# Patient Record
Sex: Male | Born: 1935 | Race: White | Hispanic: No | Smoking: Former smoker
Health system: Southern US, Community
[De-identification: ages and names within clinical notes are randomized; demographics above are authoritative.]

## PROBLEM LIST (undated history)

## (undated) DIAGNOSIS — K5900 Constipation, unspecified: Secondary | ICD-10-CM

## (undated) DIAGNOSIS — E7800 Pure hypercholesterolemia, unspecified: Secondary | ICD-10-CM

## (undated) DIAGNOSIS — E079 Disorder of thyroid, unspecified: Secondary | ICD-10-CM

## (undated) DIAGNOSIS — N400 Enlarged prostate without lower urinary tract symptoms: Secondary | ICD-10-CM

## (undated) DIAGNOSIS — J90 Pleural effusion, not elsewhere classified: Secondary | ICD-10-CM

## (undated) DIAGNOSIS — Z952 Presence of prosthetic heart valve: Secondary | ICD-10-CM

## (undated) DIAGNOSIS — I495 Sick sinus syndrome: Secondary | ICD-10-CM

## (undated) DIAGNOSIS — R972 Elevated prostate specific antigen [PSA]: Secondary | ICD-10-CM

## (undated) DIAGNOSIS — I10 Essential (primary) hypertension: Secondary | ICD-10-CM

## (undated) DIAGNOSIS — I2510 Atherosclerotic heart disease of native coronary artery without angina pectoris: Secondary | ICD-10-CM

## (undated) HISTORY — PX: OTHER SURGICAL HISTORY: SHX169

## (undated) HISTORY — PX: CORONARY ARTERY BYPASS GRAFT: SHX141

## (undated) HISTORY — DX: Elevated prostate specific antigen (PSA): R97.20

## (undated) HISTORY — DX: Constipation, unspecified: K59.00

---

## 2005-10-18 HISTORY — PX: INTRAOCULAR LENS INSERTION: SHX110

## 2006-10-18 HISTORY — PX: PACEMAKER INSERTION: SHX728

## 2006-12-30 ENCOUNTER — Ambulatory Visit: Payer: Self-pay | Admitting: Cardiology

## 2006-12-30 ENCOUNTER — Ambulatory Visit: Payer: Self-pay | Admitting: Thoracic Surgery (Cardiothoracic Vascular Surgery)

## 2007-01-04 ENCOUNTER — Ambulatory Visit: Payer: Self-pay | Admitting: Thoracic Surgery (Cardiothoracic Vascular Surgery)

## 2007-01-04 ENCOUNTER — Encounter (INDEPENDENT_AMBULATORY_CARE_PROVIDER_SITE_OTHER): Payer: Self-pay | Admitting: *Deleted

## 2007-01-04 ENCOUNTER — Inpatient Hospital Stay (HOSPITAL_COMMUNITY)
Admission: RE | Admit: 2007-01-04 | Discharge: 2007-01-18 | Payer: Self-pay | Admitting: Thoracic Surgery (Cardiothoracic Vascular Surgery)

## 2007-01-04 ENCOUNTER — Ambulatory Visit: Payer: Self-pay | Admitting: Cardiology

## 2007-01-30 ENCOUNTER — Encounter
Admission: RE | Admit: 2007-01-30 | Discharge: 2007-01-30 | Payer: Self-pay | Admitting: Thoracic Surgery (Cardiothoracic Vascular Surgery)

## 2007-01-30 ENCOUNTER — Ambulatory Visit: Payer: Self-pay

## 2007-01-30 ENCOUNTER — Ambulatory Visit: Payer: Self-pay | Admitting: Thoracic Surgery (Cardiothoracic Vascular Surgery)

## 2007-02-08 ENCOUNTER — Encounter: Payer: Self-pay | Admitting: Cardiology

## 2007-02-16 ENCOUNTER — Encounter: Payer: Self-pay | Admitting: Cardiology

## 2007-02-20 ENCOUNTER — Ambulatory Visit: Payer: Self-pay | Admitting: Thoracic Surgery (Cardiothoracic Vascular Surgery)

## 2007-02-20 ENCOUNTER — Encounter
Admission: RE | Admit: 2007-02-20 | Discharge: 2007-02-20 | Payer: Self-pay | Admitting: Thoracic Surgery (Cardiothoracic Vascular Surgery)

## 2007-03-19 ENCOUNTER — Encounter: Payer: Self-pay | Admitting: Cardiology

## 2007-04-18 ENCOUNTER — Encounter: Payer: Self-pay | Admitting: Cardiology

## 2007-05-02 ENCOUNTER — Ambulatory Visit: Payer: Self-pay | Admitting: Internal Medicine

## 2007-10-19 DIAGNOSIS — J90 Pleural effusion, not elsewhere classified: Secondary | ICD-10-CM | POA: Diagnosis present

## 2007-10-19 HISTORY — DX: Pleural effusion, not elsewhere classified: J90

## 2008-03-04 IMAGING — CR DG CHEST 2V
2 series · 2 of 2 positions shown · non-contrast
Comparison: 01/07/07.

CLINICAL DATA: Post CABG, shortness of breath and wheezing.
 CHEST - 2 VIEW:

[w chest pa]
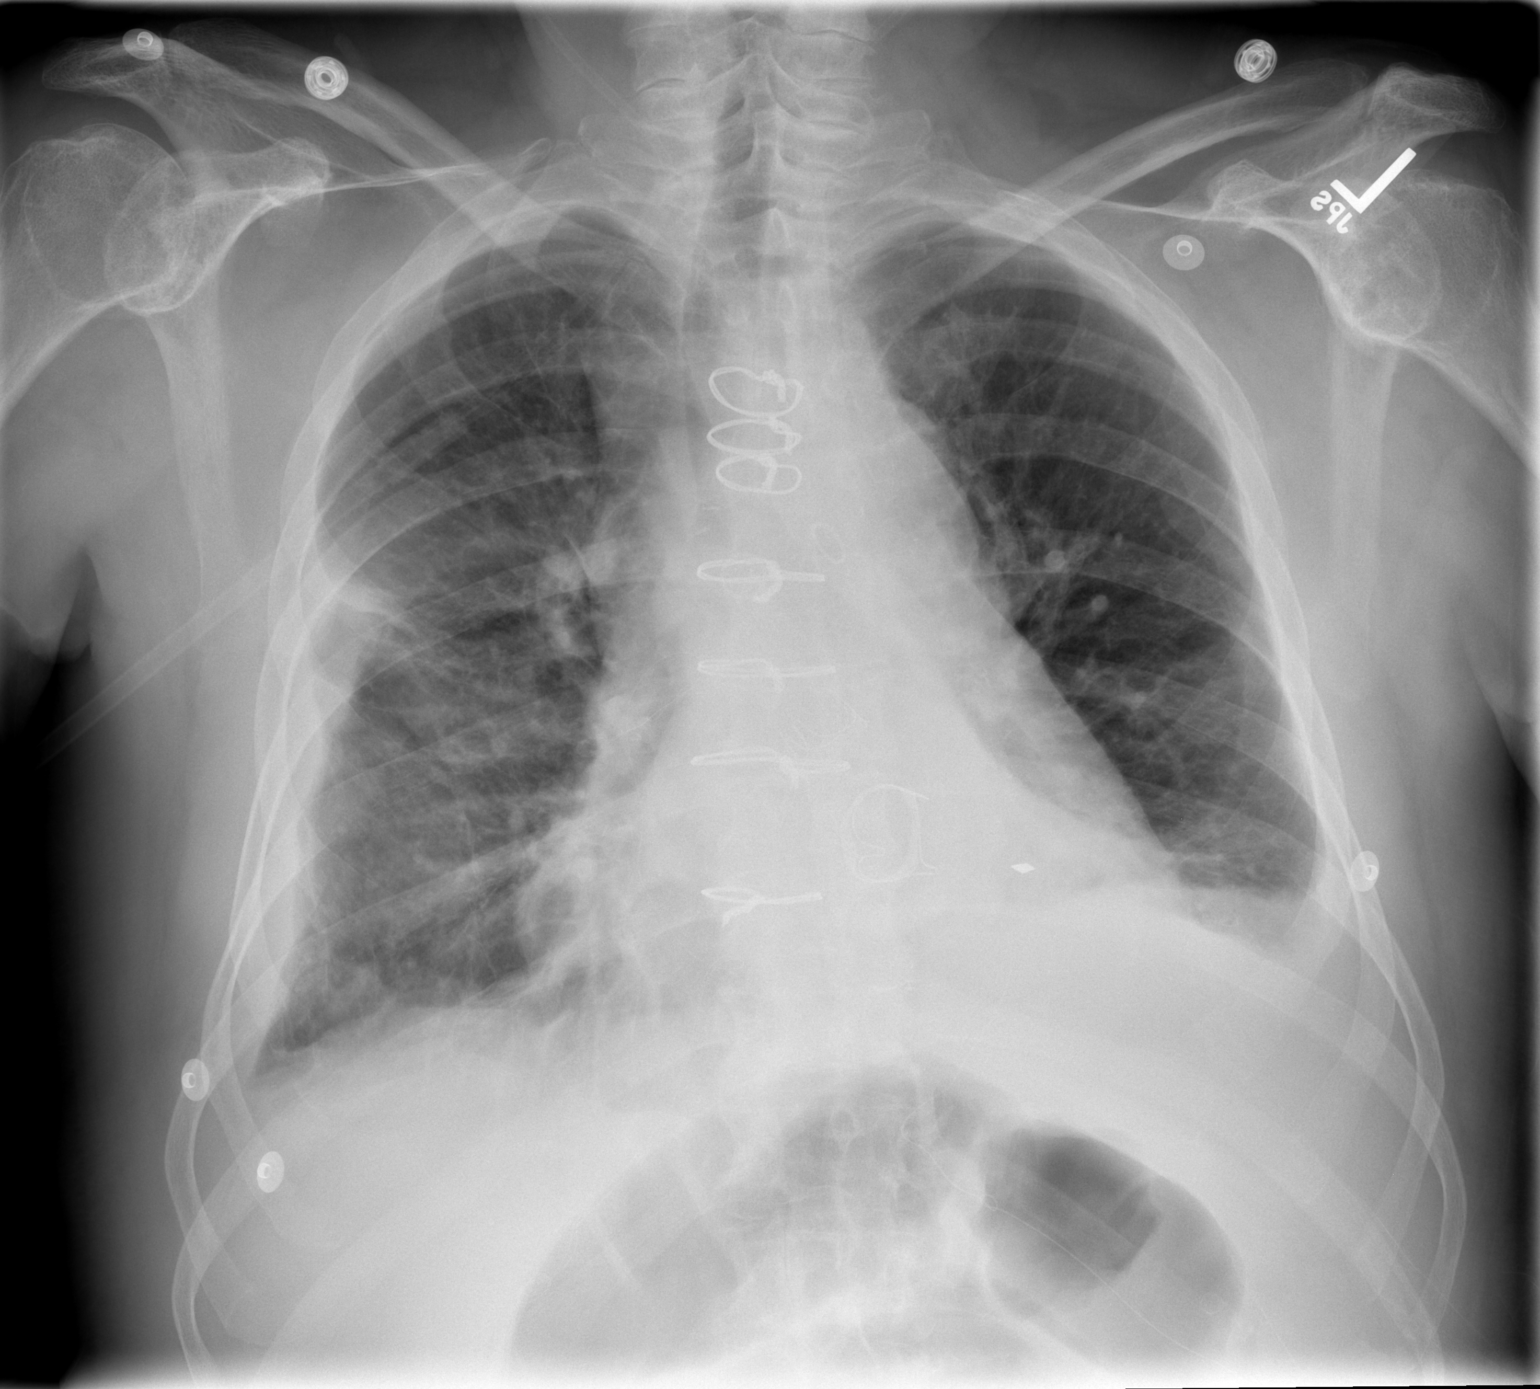

[w chest lat]
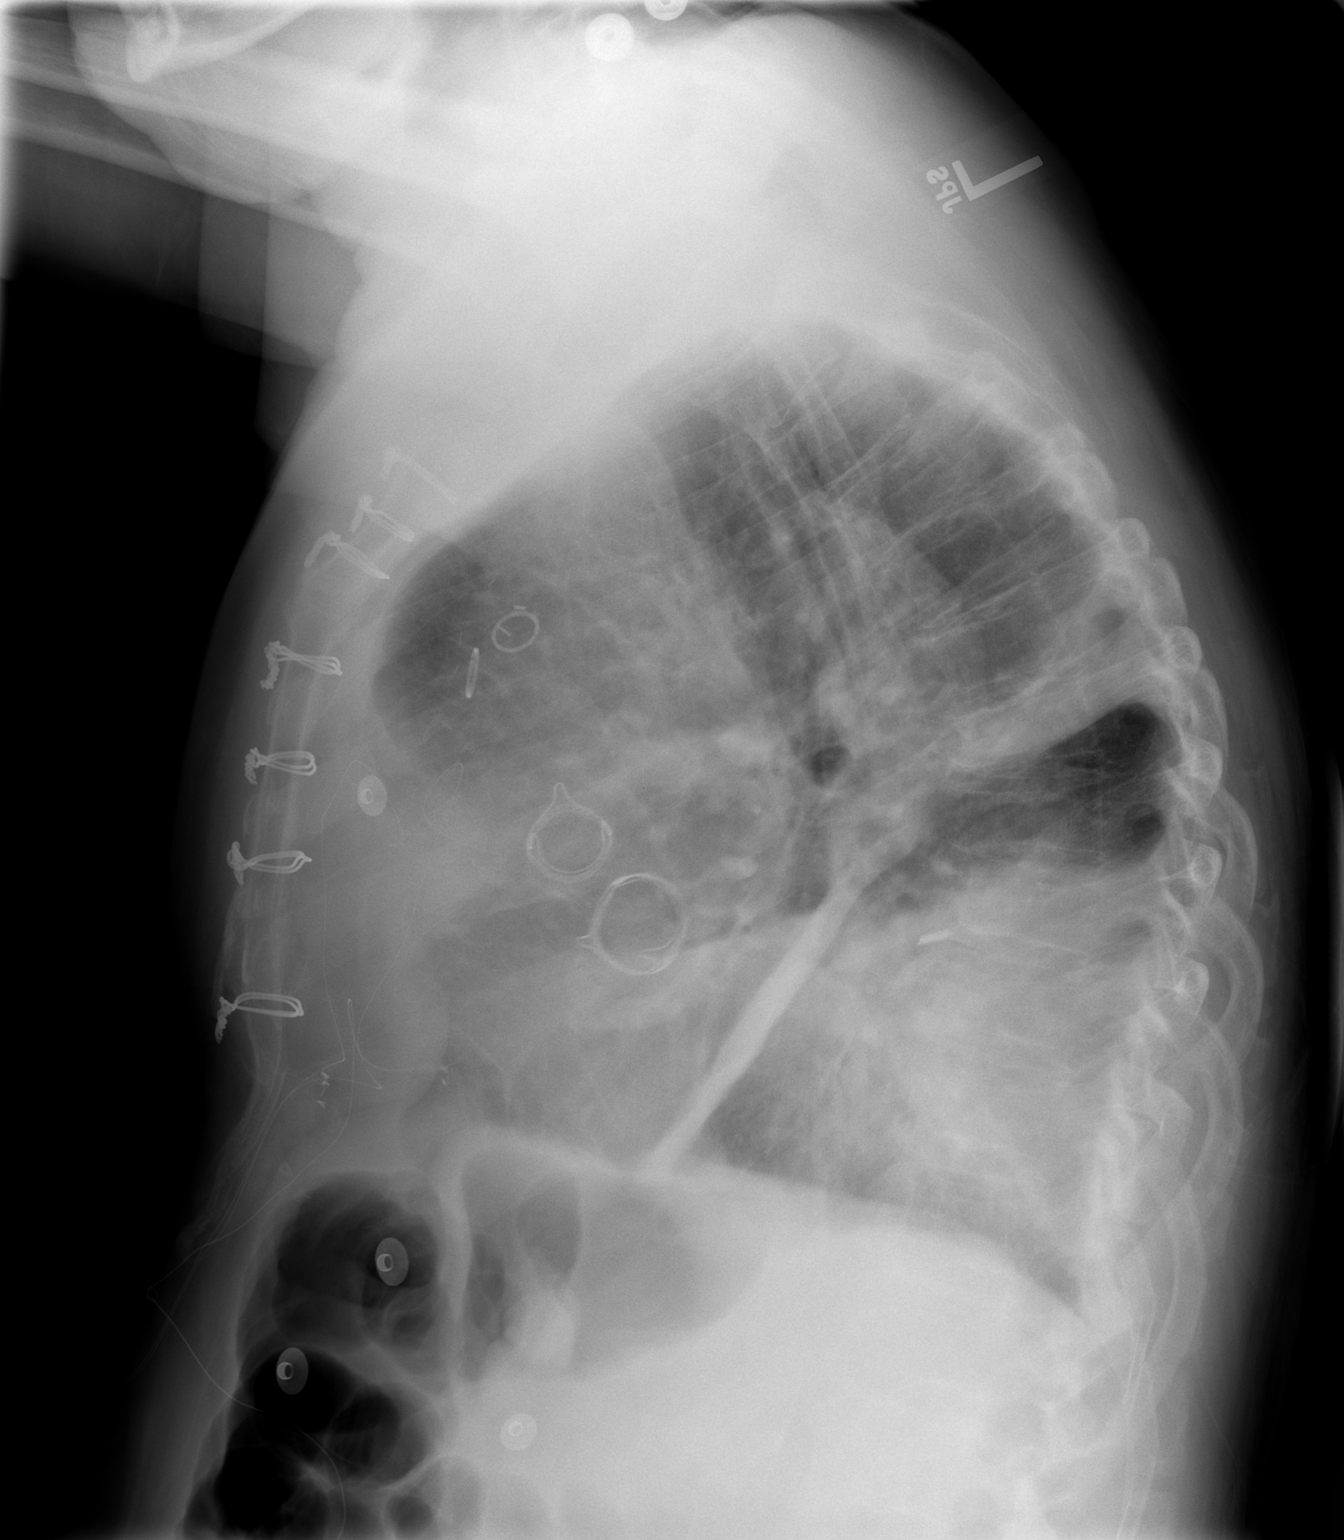

[2 of 2 positions shown; findings below may reference images not displayed]

FINDINGS: Trachea is midline. Heart size stable.  Left lower lobe atelectasis has increased in the interval. There are bilateral pleural effusions with slight increase on the right.  Laterally loculated fluid is also seen on the right.  There is interstitial prominence bilaterally, increased from the prior. Mild anterior wedging of the mid thoracic vertebral body is seen.
IMPRESSION: Pulmonary edema, bilateral pleural effusions, and worsening left lower lobe atelectasis. Please see discussion above.

## 2008-05-24 ENCOUNTER — Ambulatory Visit: Payer: Self-pay

## 2008-08-13 ENCOUNTER — Ambulatory Visit: Payer: Self-pay | Admitting: Specialist

## 2008-10-18 HISTORY — PX: TURP VAPORIZATION: SUR1397

## 2009-02-24 ENCOUNTER — Ambulatory Visit: Payer: Self-pay | Admitting: Gastroenterology

## 2009-08-11 ENCOUNTER — Ambulatory Visit: Payer: Self-pay | Admitting: General Practice

## 2009-08-27 ENCOUNTER — Inpatient Hospital Stay: Payer: Self-pay | Admitting: General Practice

## 2009-10-18 HISTORY — PX: MEDIAL PARTIAL KNEE REPLACEMENT: SHX5965

## 2009-11-11 ENCOUNTER — Ambulatory Visit: Payer: Self-pay | Admitting: General Practice

## 2009-11-26 ENCOUNTER — Inpatient Hospital Stay: Payer: Self-pay | Admitting: General Practice

## 2009-12-02 ENCOUNTER — Inpatient Hospital Stay: Payer: Self-pay | Admitting: Internal Medicine

## 2011-03-02 NOTE — Assessment & Plan Note (Signed)
[facility]                         ELECTROPHYSIOLOGY OFFICE NOTE   SA[place]CLAIR, SCHROADER                    MRN:          [id number]  DATE:05/02/2007                            DOB:          September 07, 1934    SUBJECTIVE:  Glenn [name] returns today for followup.  He is a very  pleasant elderly male, with a history of symptomatic bradycardia  following aortic valve replacement back in March.  He underwent a  permanent pacemaker insertion at that time and returns today for  followup.  Overall he is doing very well.  He has very minimal dyspnea  with exertion.   MEDICATIONS:  1. Potassium.  2. Furosemide 40 q.d.  3. Mucinex.  4. Amiodarone 200 mg b.i.d.  5. Metoprolol 50 mg q.d.  6. Aspirin 81 mg q.d.   PHYSICAL EXAMINATION:  GENERAL:  He is a pleasant elderly-appearing man,  in no acute distress.  VITAL SIGNS:  Blood pressure today 124/72, pulse 60 and regular,  respirations 18, weight 168 pounds.  NECK:  No jugular venous distention.  LUNGS:  Clear bilaterally to auscultation.  No wheezes, rales or rhonchi  are present.  CARDIOVASCULAR:  A regular rate and rhythm with normal S1 and S2.  EXTREMITIES:  Demonstrated no clubbing, cyanosis or edema.  Pulses 2+  and symmetric.   Interrogation of his pacemaker demonstrates a Medtronic [place] with P-  waves and R-waves of 2 and 16 respectively.  The impedance is 549 in the  atrium, 522 in the ventricle.  Threshold is 0.7 and 0.4 in the atrium  and 0.5 at 0.4 in the ventricle.  He was 99% A-paced.  There were no  mode switches.   IMPRESSION:  1. Symptomatic bradycardia.  2. Paroxysmal atrial fibrillation following aortic valve replacement.  3. Status post aortic valve replacement.   DISCUSSION:  Overall Glenn [name] is stable.  I have asked that he  follow up with me on a p.r.n. basis, as he will follow up with Dr. Jamse Mead in [place], who is his primary cardiologist.  We have also  asked that he discontinue his amiodarone.     Doylene Canning. Ladona Ridgel, MD  Electronically Signed    GWT/MedQ  DD: 05/02/2007  DT: 05/02/2007  Job #: [id number]   cc:   Jamse Mead, MD

## 2011-03-05 NOTE — Op Note (Signed)
NAMEMARTAVIOUS, HARTEL NO.:  [id number]   MEDICAL RECORD NO.:  [id number]          PATIENT TYPE:  INP   LOCATION:  2308                         FACILITY:  [facility]   PHYSICIAN:  [place] Forts, M.D.DATE OF BIRTH:  [birth year]   DATE OF PROCEDURE:  DATE OF DISCHARGE:                               OPERATIVE REPORT   TRANSESOPHAGEAL ECHOCARDIOGRAPHIC REPORT   INDICATIONS FOR PROCEDURE:  Glenn [name] is a 77 year old gentleman who  presents today for aortic valve replacement, coronary artery bypass  grafting to be performed by Dr. Tressie Stalker.  He is brought to the  holding area where under local anesthesia and sedation, pulmonary  catheter and radial arterial lines are inserted.  He is then taken to  the OR for routine induction of general anesthesia.  The TE probe is  then protected, lubricated and passed oropharyngeally into the stoma and  then slightly withdrawn per imaging of the cardiac structures.   PRECARDIOPULMONARY BYPASS EXAMINATION:  Left ventricle:  There is good  left ventricular contractility seen in a short axis view.  Overall there  is concentric left ventricular hypertrophy noted in all chamber walls  and segments.  Papillary muscles are well outlined.  Long axis view of  the left ventricular chamber again reveals excellent left ventricular  function.  Mitral valve:  There is heavy calcium noted in the annular area of the  mitral valve.  There is prolapse seen in a portion of the posterior  leaflet.  This is associated with on color Doppler examination 2 to 3+  mitral regurgitant flow that would be considered at least moderate in  its characteristic.  The depth of the regurgitant jet is well into the  left atrium approximately 3/4 of the extent of the left atrium.  Pulse  wave Doppler in the pulmonary valve reveals that the ST waves are  essentially normal which would indicate it is not severe, however, it is  assessed as potentially moderate MR  in this patient.  Aortic valve:  The aortic valve is heavily calcified.  There are 3 cusps  seen.  The left coronary cusp is essentially slightly mobile, but the  rest of the 2 coronary cusps non- and right coronary cusp were  essentially fused without much if any movement at all.   Right ventricle, tricuspid valve, right atrium are simply normal  chambers and valves noted.   The patient is placed on cardiopulmonary bypass.  Hypothermia is begun.   PROCEDURE:  Mitral valve replacement, aortic valve replacement and  coronary artery bypass grafting.   Deairing maneuvers were carried out and the patient was able to be  separated ultimately from cardiopulmonary bypass.   Postcardiopulmonary bypass examination, the sonographer was not  available for that so this part of the dictation will be admitted,  however, the patient was subsequently able to be returned to the cardiac  intensive care unit in stable condition.           ______________________________  [place] Forts, M.D.     JTM/MEDQ  D:  01/05/2007  T:  01/05/2007  Job:  [id number]

## 2011-03-05 NOTE — Assessment & Plan Note (Signed)
[facility]                         ELECTROPHYSIOLOGY OFFICE NOTE   COOLIDGE, GOSSARD                    MRN:          [id number]  DATE:01/30/2007                            DOB:          1934-08-26    [name] was seen today in the clinic on January 30, 2007 for a wound  check of his newly implanted Medtronic, model #SCDR01, [place].  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   On interrogation of his device today, his battery voltage is 2.78.  T  waves measured 2 to 2.8 mV with an atrial capture threshold of 0.75  volts at 0.4 ms and an atrial lead impedence of 578 ohm.  R waves  measured 16 to 22.4 mV with a ventricular capture threshold of 0.75  volts at 0.4 ms and a ventricular lead impedence of 605 ohm.  He is V-  paced about 0.3% of the time.  No changes were made in his parameters.  His Steri-Strips had already been removed.  His wound is without redness  or edema.  He will follow up with Dr. [name] in July.      Glenn Harm, LPN  Electronically Signed      Glenn [name]. Glenn Ridgel, MD  Electronically Signed   PO/MedQ  DD: 01/30/2007  DT: 01/30/2007  Job #: [id number]

## 2011-03-05 NOTE — Discharge Summary (Signed)
Glenn [name], Glenn [name] NO.:  [id number]   MEDICAL RECORD NO.:  [id number]          PATIENT TYPE:  INP   LOCATION:  2035                         FACILITY:  [facility]   PHYSICIAN:  Salvatore Decent. Cornelius Moras, M.D. DATE OF BIRTH:  1934-04-28   DATE OF ADMISSION:  01/04/2007  DATE OF DISCHARGE:                               DISCHARGE SUMMARY   PRIMARY DIAGNOSES:  1. Severe aortic stenosis.  2. Left main disease.  3. Moderate 3+ mitral regurgitation.   IN HOSPITAL DIAGNOSES:  1. Postoperative acute renal insufficiency.  2. Postoperative acute blood loss anemia.  3. Postoperative ileus.  4. Postoperative volume overload.   SECONDARY DIAGNOSES:  1. Hypertension.  2. Hyperlipidemia.  3. Status post cataract extraction.   IN HOSPITAL OPERATIONS AND PROCEDURES:  1. Aortic valve replacement using a 21 mm pericardial tissue valve.  2. Mitral valve replacement using a 25 mm pericardial tissue valve.  3. Coronary artery bypass grafting x3 using left internal mammary      artery to distal left anterior descending coronary artery,      saphenous vein graft to circumflex marginal branch, saphenous vein      graft to posterior descending coronary artery. Endoscopic saphenous      vein harvesting from right side.  4. Intraoperative transesophageal echocardiogram.   HOSPITAL COURSE:  The patient is a 77 year old gentleman with known  history of moderate aortic stenosis, hypertension, hyperlipidemia. He  now presents with progressive symptoms of exertional angina.  Echocardiogram confirmed presence of moderate to severe aortic stenosis.  Left and right heart catheterization demonstrates left main coronary  artery disease. Left ventricular function remains preserved. The patient  was seen and evaluated by Dr. Cornelius Moras. Dr. Cornelius Moras discussed with the patient  undergoing coronary artery bypass grafting as well as aortic valve  repair with intraoperative transesophageal echocardiogram. He  discussed  risks and benefits with the patient. The patient acknowledges  understanding and agrees to proceed. Surgery was scheduled for  March19,2008. For details of the patient's past medical History  and Physical exam, please see dictated History and Physical.   The patient was taken to the operating room [id number] where he  initially underwent intraoperative transesophageal echocardiogram. This  demonstrated severe aortic stenosis as by 2-D echocardiogram as well as  3+ mitral regurgitation. Dr. Cornelius Moras then proceeded with aortic valve  replacement using a 21 mm pericardial tissue valve, mitral valve  replacement using 25 mm pericardial tissue valve, and coronary artery  bypass grafting x3 using a left internal mammary artery to distal left  anterior descending coronary artery, saphenous vein graft to circumflex  marginal branch, saphenous vein graft to posterior descending coronary  artery. Endoscopic saphenous vein harvest was done from right side. The  patient tolerated this procedure. Was transferred to the intensive care  unit in stable condition. Postoperatively the patient was noted to be  hemodynamically stable. He was extubated the evening of surgery.  Following extubation the patient noted to be alert and oriented x4.  Postop day #1 the patient remained hemodynamically stable with  hemoglobin and hematocrit 10.3 and 29.7. Systolic blood pressure was  stable. The patient was able to be weaned from all drips. The patient's  postoperative chest x-ray showed no significant pleural effusion or  pneumothorax. Chest tubes were DC'd. During the patient's postoperative  course his hemoglobin and hematocrit dropped to 7.9 and 22.4. At that  time 1 unit of packed red blood cells was transfused. The following day  hemoglobin/hematocrit increased appropriately to 9.5 and 26.0. Has  remained stable during the remainder of his postoperative course. The  patient also developed acute renal  insufficiency with creatinine  increased to 2.07 postop day #3. He was back near his baseline weight at  that time, and Levophed was DC'd. The following day the patient's  creatinine started trending down and by postop day #7 creatinine was  1.04. The patient's weight was stable and back near  baseline. Postop  day #3 the patient noted to have diarrhea. Postop laxatives were held at  that time. They continued, and stool was sent for C. difficile. This  came back negative. It was felt the patient was recovering from a  resolving ileus, very improved. By postop day #5 the patient was  tolerating regular diet well. No nausea, vomiting. During the patient's  postoperative course he did have several runs of ventricular  tachycardia. Beta blocker was increased appropriately. The patient's  heart rate stabilized, and it was felt blood pressure was stable. The  patient remained afebrile postoperatively. He was able to be weaned off  oxygen satting greater than 90% on room air. The patient was noted to be  in normal sinus rhythm prior to discharge home. Pulmonary status was  stable. Incisions were clean, dry, and intact and healing well. The  patient was out of bed and ambulating well. He was tolerating diet well.  Last labs noted postop day #6 with white count 5.8, hemoglobin 9.5,  hematocrit 26.8, platelet count 173. BNP on postop day #7 showed sodium  133, potassium 3.8, chloride 99, bicarb 27, BUN 16, creatinine 1.04,  glucose 98.   The patient is tentatively ready for discharge home in the a.m. postop  day #8 March27,2008.   FOLLOW-UP APPOINTMENTS:  A follow-up appointment has been arranged with  Dr. Cornelius Moras for Monday, April14,2008, at 11:16 a.m.. The patient will  need to obtain a chest x-ray one hour prior to his appointment at  [facility]. The patient will also need to follow up with  Dr. Darrold Junker in two weeks. He will need to call to schedule this appointment.   ACTIVITY:   Patient instructed no driving until released to do so, no  heavy lifting over 10 pounds. He is told to ambulate 3-4 times per day,  progress as tolerated, and continue his breathing exercises.   INCISIONAL CARE:  The patient told to shower washing his incisions using  soap and water. He is to contact the office if he develops any drainage  or opening from any of his incision sites.   DIET:  The patient is educated on diet to be low-fat, low-salt.   DISCHARGE MEDICATIONS:  1. Aspirin 325 mg daily.  2. Lopressor 75 mg b.i.d.  3. Advicor 500/20 daily.  4. Ultram 50 mg 1-2 tabs q.4-6 h. p.r.n. pain.      Theda Belfast, PA      Salvatore Decent. Cornelius Moras, M.D.  Electronically Signed    KMD/MEDQ  D:  01/11/2007  T:  01/11/2007  Job:  [id number]

## 2011-03-05 NOTE — Consult Note (Signed)
NAMEKYLO, GAVIN NO.:  [id number]   MEDICAL RECORD NO.:  [id number]          PATIENT TYPE:  INP   LOCATION:  2035                         FACILITY:  [facility]   PHYSICIAN:  Salvadore Farber, MD  DATE OF BIRTH:  [birth year]   DATE OF CONSULTATION:  01/13/2007  DATE OF DISCHARGE:                                 CONSULTATION   PRIMARY CARDIOLOGIST:  Dr. Jamse Mead in [place].   REASON FOR CONSULTATION:  Postop atrial fibrillation.   HISTORY OF PRESENT ILLNESS:  Mr. Harnois is a 77 year old gentleman now  postop day #9 after coronary artery bypass grafting, aortic valve  replacement, and mitral valve replacement. Pericardial tissue valves  were used in both positions. His preop ejection fraction was 60%. His  postoperative course has been slowed with some diarrhea which is now  resolved. He does remain dyspneic with minimal exertion.   This morning he developed new onset atrial fibrillation with rapid  ventricular response, ventricular rate approximately 130 beats per  minute. He is not having any chest discomfort or increase in his  baseline dyspnea. He is not having any lightheadedness.   PAST MEDICAL HISTORY:  1. Coronary artery disease status post CABG.  2. Status post aortic valve replacement, mitral valve replacement (21      mm pericardial tissue valve at the aortic position and 25 mm      pericardial tissue valve in the mitral position).  3. Postoperative renal insufficiency now resolved.  4. Postoperative ileus now resolved.  5. Hypertension.  6. Hypercholesterolemia.  7. Status post cataract extraction.   ALLERGIES:  None known.   CURRENT MEDICATIONS:  1. Aspirin 325 mg daily.  2. Lovastatin 20 mg daily.  3. Metoprolol 25 mg twice daily.  4. Ultram p.r.n..  5. Lovenox 40 mg daily started today.   SOCIAL HISTORY:  The patient is widowed. Lives with his daughter in  [place]. He retired from working in Designer, fashion/clothing. He has a remote  tobacco history, but quit more than 30 years ago. Denies alcohol use.   FAMILY HISTORY:  Noncontributory.   REVIEW OF SYSTEMS:  Negative in detail except as above.   PHYSICAL EXAMINATION:  GENERAL:  He is fairly comfortable-appearing in  no distress.  VITAL SIGNS:  Heart rate 140, blood pressure 138/80, respiratory rate  22, oxygen saturation 99% on room air, afebrile.  HEENT:  Normal.  MUSCULOSKELETAL:  Normal.  SKIN:  Notable for ecchymosis along the inner aspect of his right thigh  at his saphenous vein harvest site. There is no hematoma.  NECK:  He has no thyromegaly. There is jugular venous distension to the  angle of the jaw at 45 degrees. He has no cervical or supraclavicular  lymphadenopathy.  LUNGS:  Respiratory effort is normal. Clear to auscultation though  markedly decreased breath sounds in the lower portion of both lung  fields.  HEART:  He has a nondisplaced point of maximal cardiac impulse. He is  irregularly irregular rhythm and is tachycardic. Normal S1, S2. No  murmur or rub.  ABDOMEN:  Soft, nondistended, nontender. There is no hepatosplenomegaly.  Bowel  sounds are normal.  EXTREMITIES:  Warm without clubbing, cyanosis, edema or ulceration.  Carotid pulses 2+ bilaterally without bruit.  NEUROLOGIC:  He is alert and oriented x3 with normal affect and normal  neurologic exam.   IMPRESSION/RECOMMENDATIONS:  1. Atrial fibrillation with rapid ventricular response:  Discussed      with Dr. Donata Clay. He has only been on diltiazem for 15 minutes      and did not receive a bolus. Will bolus him with diltiazem. Will      increase his metoprolol to 50 mg three times daily. What had      previously been felt was bradycardia actually appears to be blocked      PACs. I thus think he will likely tolerate metoprolol at the prior      dose. We will also initiate amiodarone. It is my hope that will be      able to discontinue the diltiazem once the higher dose of       metoprolol was added. I will also add Coumadin and increase his      Lovenox to treatment dose.  2. Congestive heart failure:  Ejection fraction was preserved      preoperatively. Will check echocardiogram on Monday. He remains      quite volume overloaded. We will add Lasix.      Salvadore Farber, MD  Electronically Signed     WED/MEDQ  D:  01/13/2007  T:  01/13/2007  Job:  [id number]   cc:   Kerin Perna, M.D.

## 2011-03-05 NOTE — Op Note (Signed)
NAMEMARGUERITE, [name] NO.:  [id number]   MEDICAL RECORD NO.:  [id number]          PATIENT TYPE:  INP   LOCATION:  2035                         FACILITY:  [facility]   PHYSICIAN:  Doylene Canning. Ladona Ridgel, MD    DATE OF BIRTH:  [birth year]   DATE OF PROCEDURE:  01/16/2007  DATE OF DISCHARGE:                               OPERATIVE REPORT   PROCEDURE PERFORMED:  Implantation of a dual-chamber pacemaker.   INDICATIONS:  Symptomatic tachy-brady syndrome following aortic valve  replacement, mitral valve replacement, and coronary artery bypass  surgery in a patient with underlying left bundle branch block.   PROCEDURE:  After informed consent was obtained, the patient is taken  diagnostic EP lab in the fasting state.  After the usual preparation and  draping, 30 mL of lidocaine was infiltrated in the left infraclavicular  region.  A 5 cm incision was carried out over this region.  Electrocautery was utilized to dissect down to the fascial plane.  The  left subclavian vein was punctured times two and the Medtronic model  5076 58 cm active fixation pacing lead, serial number [id number] was  advanced into the right ventricle and the Medtronic model 5076 52 cm  active fixation pacing lead serial number [id number] was advanced to the  right atrium.  Mapping was subsequently carried out in the right  ventricle, at the final site the R-waves were 11 mV, the pacing  impedance was 742 ohms with the lead actively fixed and the pacing  threshold 0.7 volts and 0.4 milliseconds.  The 10 volt pacing did not  stimulate the diaphragm and with lead actively fixed there was a large  injury current.  With all of the above, attention was then turned to  placement atrial lead which was placed in the anterolateral portion of  the right atrium where P-waves measured 3 mV and the pacing impedance  606 ohms with the lead actively fixed, the threshold 0.5 volts and 0.4  milliseconds.  The 10 volts pacing  again did not stimulate the diaphragm  and with the lead actively fixed there was a nice injury current  demonstrated.  With all the above, the leads were secured to  subpectoralis fascia with a silk suture.  The sewing sleeve was secured  with silk suture.  Electrocautery was utilized to make a subcutaneous  pocket.  Kanamycin irrigation was utilized to irrigate the pocket.  The  Medtronic [place] serial number [id number] was then connected to the  atrial and ventricular pacing leads, and placed in the subcutaneous  pocket.  The generator was secured with silk suture.  Additional  kanamycin was utilized to irrigate the pocket and the incision then  closed with a layer of 2-0 Vicryl, followed by layer of 3-0 Vicryl,  followed by layer of 4-0 Vicryl.  Benzoin was painted on skin, Steri-  Strips were applied, and pressure dressing was placed.  The patient was  returned to his room in satisfactory condition.   COMPLICATIONS:  There were no immediate procedure complications.   RESULTS:  This demonstrate successful implantation of a Medtronic dual-  chamber pacemaker  in a patient with symptomatic tachy-brady syndrome  following aortic and mitral valve replacement and bypass surgery.      Doylene Canning. Ladona Ridgel, MD  Electronically Signed     GWT/MEDQ  D:  01/16/2007  T:  01/16/2007  Job:  [id number]   cc:   Horald Chestnut, M.D.  Bruce Donath, M.D.

## 2011-03-05 NOTE — Op Note (Signed)
NAMEGLENMORE, KARL             ACCOUNT NO.:  [id number]   MEDICAL RECORD NO.:  [id number]          PATIENT TYPE:  INP   LOCATION:  2308                         FACILITY:  [facility]   PHYSICIAN:  Salvatore Decent. Cornelius Moras, M.D. DATE OF BIRTH:  06/06/1934   DATE OF PROCEDURE:  01/04/2007  DATE OF DISCHARGE:                               OPERATIVE REPORT   PREOPERATIVE DIAGNOSIS:  Severe aortic stenosis with left main disease.   POSTOPERATIVE DIAGNOSIS:  Severe aortic stenosis with left main disease  with moderate (3+) mitral regurgitation.   PROCEDURE:  Median sternotomy for aortic valve replacement (21 mm  pericardial tissue valve), mitral valve replacement (25 mm pericardial  tissue valve) and coronary artery bypass grafting x3 (left internal  mammary artery to distal left anterior descending coronary artery,  saphenous vein graft to circumflex marginal branch, saphenous vein graft  to posterior descending coronary artery, endoscopic saphenous vein  harvest from right thigh).   SURGEON:  Salvatore Decent. Cornelius Moras, M.D.   ASSISTANT:  Zadie Rhine, P.A.-C.   ANESTHESIA:  General.   BRIEF CLINICAL NOTE:  The patient is a 77 year old gentleman with known  history of moderate aortic stenosis, hypertension, and  hypercholesterolemia.  He now presents with progressive symptoms of  exertional angina.  Echocardiogram confirms the presence of moderate to  severe aortic stenosis.  Left and right heart catheterization  demonstrates left main coronary artery disease.  Left ventricular  function remains preserved.  A full consultation note has been dictated  previously.  The patient and his family have been counseled at length  regarding the indications, risks, and potential benefits of surgery.  Alternative treatment strategies have been discussed.  They understand  and accept all associated risks of surgery and desire to proceed as  described.   OPERATIVE FINDINGS:  1. Normal left ventricular  systolic function.  2. Severe calcific aortic stenosis.  3. Moderate (3+) mitral regurgitation with severely calcified      posterior mitral annulus.  4. Mild to moderate left ventricular hypertrophy.  5. Good quality saphenous vein conduit.  6. Good quality left internal mammary artery conduit, although      somewhat small caliber.  7. Good quality target vessels for grafting.   OPERATIVE NOTE IN DETAIL:  The patient was brought to the operating room  on the above mentioned date and central monitoring is established by the  anesthesia service under the care and direction of Dr. Sharee Holster.  Specifically, a Swan-Ganz catheter was placed through the right internal  jugular approach.  A radial arterial line was placed.  Intravenous  antibiotics are administered.  Following induction with general  endotracheal anesthesia, a Foley catheter was placed.  The patient's  chest, abdomen, both groins, and both lower extremities are prepped and  draped in a sterile manner.   Baseline transesophageal echocardiogram is performed by Dr. Jacklynn Bue.  This demonstrates mild to moderate left ventricular hypertrophy with  normal left ventricular systolic function.  There is severe calcific  aortic stenosis.  The aortic valve is tricuspid but heavily calcified.  Two of the three cusps appear to be relatively immobile.  There is also  moderate (3+) mitral regurgitation.  There is some mild prolapse of a  portion of the posterior leaflet of the mitral valve, but the main  pathological lesion appears to be severe calcification of the entire  posterior mitral annulus.  As a result, the posterior leaflet is,  overall, somewhat restricted in many areas and there is a broad central  jet of mitral regurgitation that extends virtually completely across the  left atrium.  There is no flow reversal in the pulmonary veins.  This  degree of mitral regurgitation is appreciated in the setting of complete  general  anesthesia with relatively low systemic blood pressure.  After  considerable discussion, intervention on the mitral valve is felt to be  prudent due to concerns regarding the unlikely ability of aortic valve  replacement to improve the degree of mitral regurgitation in any  significant way.  The patient's family is notified intraoperatively.   A segment of greater saphenous vein is obtained from the patient's right  thigh using endoscopic vein harvest technique through a small incision  made just above the right knee.  The saphenous vein is a good quality  conduit.  After the saphenous vein is removed, the small surgical  incision is closed in multiple layers with running absorbable suture.   A median sternotomy incision is performed and the left internal mammary  artery dissected from the chest wall and prepared for bypass grafting.  The left internal mammary artery was slightly small caliber but,  otherwise, good quality.  It has adequate flow.  The patient is  heparinized systemically and the internal mammary artery is transected  distally.   The pericardium was opened.  The ascending aorta is mildly dilated and  mildly diseased with atherosclerotic plaque.  The ascending aorta is  cannulated at a level just beyond the innominate artery.  A venous  cannula is placed directly in the superior vena cava.  A second venous  cannula is placed low in the right atrium with tip extended down the  inferior vena cava.  Cardiopulmonary bypass is begun.  A retrograde  cardioplegia catheter was placed through the right atrium into the  coronary sinus.  A temperature probe is placed in the left ventricular  septum and pericardial cardioplegia catheter is placed in the ascending  aorta.   The patient is allowed to cool passively to a minimum core temperature  of 28 degrees Centigrade.  The aortic Cross clamp was applied and cold blood cardioplegia is administered initially in an antegrade  fashion  through the aortic root.  Ice saline slush is applied for topical  hypothermia.  The initial cardioplegia arrest and myocardial cooling are  felt to be excellent.  Repeat doses of cardioplegia are administered  intermittently throughout the crossclamp portion of the operation  through the aortic root and down the subsequently placed vein grafts to  maintain left ventricular septal temperature below 15 degrees  Centigrade. Cardioplegia is also administered intermittently retrograde  through the coronary sinus catheter after aortotomy has been performed.   The following distal coronary anastomoses are performed:   1. The circumflex marginal branch is grafted with a saphenous vein      graft in an end-to-side fashion.  This vessel measures 2 mm and is      a good quality target vessel at the site of distal grafting.  2. The posterior descending coronary artery is grafted with a      saphenous vein graft in an  end-to-side fashion.  This vessel      measures 1.5 mm in diameter and is a good quality target vessel at      the site of distal grafting.  3. The distal left anterior descending coronary artery is grafted with      the left internal mammary artery in an end-to-side fashion.  This      vessel measures 2 mm in diameter and is a good quality target      vessel for grafting.   A left atriotomy incision is performed posteriorly through the intra-  atrial groove.  The mitral valve is exposed using a self-retaining  retractor.  Exposure is technically difficult due to the degree of left  ventricular hypertrophy and the degree of severe calcification in the  mitral annulus making mobility of the mitral annulus restricted.  The  mitral valve is carefully examined.  There is moderate degenerative  sclerosis involving the entire mitral valve.  There is one area of  prolapse in the P3 portion of the mitral valve, although this is a  relatively small portion of the leaflet and the  degree of prolapse is  mild.  There is severe restriction of the entire posterior leaflet due  to severe calcification involving the entire posterior annulus extending  from commissure to commissure.  There is some mild calcification in the  free margin of the anterior leaflet, as well, although the anterior  leaflet has good mobility.  Due to the degree calcification and  restriction of the posterior leaflet, valve replacement is felt to be  the most prudent course.  The anterior leaflet is excised with care to  preserve primary chordae to both anterior and posterior papillary  muscle.  These are ultimately incorporated in the valve replacement  sutures and displaced laterally.  The posterior leaflet is split in the  mid portion but left intact.  Mitral valve replacement is performed  using interrupted 2-0 Ethibond horizontal mattress pledgeted sutures  with pledgets in the supra-annular position.  Once all the sutures had been placed, the valve annulus is sized.  The valve annulus corresponds  to approximately 27 mm bioprosthetic tissue valve.  A 25 mm valve is  chosen due to the plans for concomitant aortic valve replacement and  concerns regarding potential difficulty created with over sizing the  mitral valve.  An Edwards bovine pericardial tissue valve (model #6900P,  serial number [id number]) is secured in place uneventfully.  After the  valve has been completely deployed and secured in place, the sewing ring  is tested circumferentially to look for any signs of possible  perivalvular communication and none are appreciated.  The left atriotomy  incision is closed posteriorly using a two layer closure of running 3-0  Prolene suture.   Attention is now directed to the aortic valve.  An oblique transverse  aortotomy incision is performed.  The aortic valve is exposed.  The  aortic valve is tricuspid and heavily calcified.  The aortic valve is  excised sharply.  The aortic annulus is  relatively free of significant  calcification and debridement is technically straight forward.  The  aortic root is copiously irrigated with iced saline solution.  The  aortic annulus is sized to accept a 21 mm stented bovine pericardial  tissue valve.  Aortic valve replacement is performed using interrupted 2-  0 Ethibond horizontal mattress pledgeted sutures with pledgets in the  subannular position.  An [employer] series pericardial tissue valve  (model number  3000, serial number [id number]) is secured in place  uneventfully.  Care is taken to secure the sutures underneath the left  main coronary artery first, as this portion of the annulus is also  notably restricted due to the underlying calcification around the mitral  annulus.  Once the valve is completely seated, the valve is irrigated  with saline solution.  The left main and the right coronary artery are  well away from the sewing ring.  Rewarming is begun.  The aortotomy is  closed using a two layered closure of running 4-0 Prolene suture.   Both proximal saphenous vein anastomoses are performed  directly to the  ascending aorta prior to removal of the aortic cross clamp.  The patient  is placed in Trendelenburg position.  Left ventricular septal  temperature rises appropriately with reperfusion of the left internal  mammary artery.  One final dose of warm retrograde hot shot cardioplegia  is administered.  The lungs are filled and the heart filled to evacuate  any residual air through the aortic root.  The aortic cross clamp is  removed after a total cross clamp time of 215 minutes.  The heart begins  to beat spontaneously without need for cardioversion.  All proximal and  distal coronary anastomoses are inspected for hemostasis and appropriate  graft orientation.  The aortotomy and left atriotomy incisions are  inspected for hemostasis.  Epicardial pacing wires are affixed to the  right ventricular free wall and to the right  atrial appendage.  The patient is rewarmed to 37 degrees Centigrade temperature.  Protamine  infusion is begun at 3 mcg per kg per minute.  The IVC cannula is  removed and its cannulation site oversewn with Prolene suture.  The  patient is weaned from cardiopulmonary bypass without difficulty.  The  patient's rhythm at separation from bypass is third degree AV block.  AV  sequential pacing is employed.  Normal sinus rhythm resumed  spontaneously prior to transport out of the operating room.  Total  cardiopulmonary bypass time for the operation is 251 minutes.   Follow up transesophageal echocardiogram performed by Dr. Diamantina Monks  after separation from bypass demonstrates normal left ventricular  function.  There are well seated bioprosthetic tissue valves in the  aortic and mitral position with trace residual aortic insufficiency and  mitral regurgitation as expected.  No other abnormalities are noted.  There is insignificant residual air.  The venous and arterial cannulae  are removed uneventfully.  Protamine is administered to reverse the  anticoagulation.  The mediastinum and the left chest are irrigated with  saline solution containing vancomycin.  Meticulous surgical hemostasis  is ascertained.  The mediastinum and both left and right pleural spaces  are drained with four chest tubes exited through separate stab incisions  inferiorly.  The soft tissues anterior to the aorta are reapproximated  loosely.  The sternum is closed with double strength sternal wire.  The  soft tissues anterior to the sternum are closed in multiple layers and  the skin is closed with running subcuticular skin closure.   The patient tolerated the procedure well and is transported to the  surgical intensive care unit in stable condition.  There are no  intraoperative complications.  All sponge, instrument, and needle counts  were verified as correct at the completion of the operation.  The  patient was  transfused 2 units packed red blood cells during  cardiopulmonary bypass due to anemia which was present preoperatively  and  exacerbated by surgery.      Salvatore Decent. Cornelius Moras, M.D.  Electronically Signed     CHO/MEDQ  D:  01/04/2007  T:  01/04/2007  Job:  [id number]

## 2011-03-05 NOTE — H&P (Signed)
NAMEJONNIE, TRUXILLO             ACCOUNT NO.:  [id number]   MEDICAL RECORD NO.:  [id number]           PATIENT TYPE:   LOCATION:                                 FACILITY:   PHYSICIAN:  Salvatore Decent. Cornelius Moras, M.D. DATE OF BIRTH:  [birth year]   DATE OF ADMISSION:  01/04/2007  DATE OF DISCHARGE:                              HISTORY & PHYSICAL   CHIEF COMPLAINT:  Exertional chest pain and mid scapular back pain.   HISTORY OF PRESENT ILLNESS:  Mr. Spielmann is a 77 year old widowed white  male, who currently lives in [place], with known history of moderate  aortic stenosis, hypertension, and hypercholesterolemia.  The patient  states that he was first noted to have a heart murmur by Dr. Micah Noel  approximately 3 years ago.  Follow-up echocardiograms have revealed mild  to moderate aortic stenosis.  Over the last year he has developed  progressive symptoms of exertional pain that typically begins as  discomfort in his mid scapular region and sometimes radiates to the  chest.  This pain is always brought on with physical exertion and  usually promptly relieved with rest.  The frequency and severity of  symptoms have progressed over the last several months.  Most recent  follow-up echocardiogram was performed June 2007, and by report this  revealed evidence of moderate to severe aortic stenosis with peak and  mean transvalvular gradients across the valve of 49.5 and 27.7 mmHg  respectively.  The calculated aortic valve area was 0.99 cm2.  The  patient recently returned for evaluation by Dr. Darrold Junker and was  subsequently scheduled for elective left and right heart  catheterization.  This was performed earlier [facility] at [facility], and findings were compatible with moderate to severe  aortic stenosis as well as left main disease with three-vessel coronary  artery disease.  Left ventricular systolic function remained preserved.  Mr. Wenke was referred for possible  elective aortic valve replacement  and coronary artery bypass grafting.   REVIEW OF SYSTEMS:  GENERAL:  The patient reports normal appetite.  He  has gained a few pounds over the last year, although not a significant  amount.  He currently is 5 feet 7 inches tall and weighs approximately  178 pounds.  The patient does report some mild exertional fatigue.  He  otherwise feels well.  CARDIAC:  Notable for exertional angina as  described.  The patient denies any chest pain or mid scapular pain  occurring either at rest or with sleeping at night.  He denies any  prolonged episodes of pain unrelieved by rest.  He reports mild  exertional shortness of breath which has slowly been progressive.  He  denies resting shortness of breath, PND, orthopnea, or lower extremity  edema.  He reports occasional mild dizzy spells, especially if he stands  up suddenly.  He denies any syncopal episodes.  RESPIRATORY:  Negative.  The patient denies productive cough, hemoptysis, wheezing.  GASTROINTESTINAL:  Negative.  The patient does report occasional  constipation.  He denies any difficulty swallowing.  He reports no  hematochezia, hematemesis, melena.  He  has occasional hemorrhoids with  occasional small amounts of bright red blood associated.  GENITOURINARY:  Negative.  The patient reports no difficulty voiding.  MUSCULOSKELETAL:  Notable for some chronic problems with his right knee, although this  does not seem to slow him down too much.  NEUROLOGIC:  Notable for some  chronic numbness involving the first and second toes of the right foot.  This is chronic and unchanged.  The patient otherwise denies any  transient monocular blindness or transient numbness or weakness  involving either upper or lower extremity.  PSYCHIATRIC:  Negative.  HEENT:  Negative.  The patient is edentulous with full set upper and  lower dentures.  INFECTIOUS:  Negative.   PAST MEDICAL HISTORY:  1. Aortic stenosis.  2.  Hypertension.  3. Hypercholesterolemia.   PAST SURGICAL HISTORY:  Cataract extraction.   FAMILY HISTORY:  Noncontributory.   SOCIAL HISTORY:  The patient is widowed and lives with his daughter in  [place].  He has retired most recently from the [employer].  He  still remains quite active physically.  He has a remote history of  tobacco use, although he quit smoking more than 30 years previously.  He  denies any alcohol consumption.   CURRENT MEDICATIONS:  1. Aspirin 325 mg daily.  2. Diovan 160 mg daily.  3. Hydrochlorothiazide 25 mg daily.  4. Advicor 20/500 1 tablet daily.  5. Toprol XL 50 mg daily.   DRUG ALLERGIES:  None known.   PHYSICAL EXAMINATION:  The patient is a well-appearing male who appears  his stated age in no acute distress.  Blood pressure is 120/60, pulse 74 and regular, oxygen saturation 98% on  room air.  HEENT: Exam is grossly unrevealing.  NECK:  Supple.  There is no cervical nor supraclavicular  lymphadenopathy.  There is no jugular venous distension.  No carotid  bruits are noted.  CHEST:  Auscultation of the chest is notable for clear breath sounds  which are symmetrical bilaterally.  No wheezes or rhonchi are noted.  CARDIOVASCULAR:  Exam is notable for regular rate and rhythm.  There is  a prominent grade 4/6 crescendo decrescendo harsh systolic murmur heard  all across the precordium with radiation to the neck.  No diastolic  murmurs are noted.  ABDOMEN:  Soft, nontender.  There are no palpable masses.  Bowel sounds  are present.  The liver edge is not enlarged.  EXTREMITIES:  Warm and  well-perfused.  There is no lower extremity edema.  Distal pulses are  somewhat diminished but trace palpable in the posterior tibial positions  bilaterally.  There is no sign of significant venous insufficiency.  SKIN:  Clean, dry, healthy-appearing throughout.  RECTAL AND GU EXAMS:  Both deferred.  NEUROLOGIC:  Examination is grossly  nonfocal.  DIAGNOSTIC TESTS:  Cardiac catheterization performed today by Dr.  Darrold Junker at [facility] is reviewed.  This  demonstrates left main disease with three-vessel coronary artery disease  and normal left ventricular systolic function as well as moderate to  severe aortic stenosis.  Specifically, there is 70% stenosis of the  distal left main coronary artery with 95% ostial stenosis of the left  circumflex coronary artery.  There is 50% proximal stenosis of the left  anterior descending coronary artery.  There is right-dominant coronary  circulation.  There is 50-60% stenosis of the mid right coronary artery.  Left ventricular systolic function appears preserved with ejection  fraction estimated 60%.  The resting cardiac output  by thermodilution is  4.4 liters per minute with cardiac index of 2.3.  PA pressures measured  28/13 with a pulmonary capillary wedge pressure of 10.  Peak to peak  gradient across the aortic valve measured 26 mmHg.  Calculated aortic  valve area was estimated 1.17 cm2 with a mean transvalvular gradient of  18 mmHg.   IMPRESSION:  Moderate aortic stenosis with left main disease and three-  vessel coronary artery disease, preserved left ventricular systolic  function and progressive symptoms of exertional angina.  I believe that  Mr. Geralds would best be treated with prompt elective aortic valve  replacement and coronary artery bypass grafting.   PLAN:  I have discussed at length the indications, risks, potential  benefits of surgery with Mr. Wolters and his daughter.  Alternative  treatment strategies have been discussed in detail.  They understand and  accept all associated risks of surgery including but not limited to risk  of death, stroke, myocardial infarction, congestive heart failure,  respiratory failure, pneumonia, bleeding requiring blood transfusion,  arrhythmia, heart block with bradycardia requiring permanent  pacemaker,  recurrent coronary artery disease, or late complications related to  prosthetic valve.  We have also discussed options with respect to type  of valve prosthesis to be utilized to replace the patient's native  aortic valve.  We have discussed the relative risks and benefits of use  of a mechanical prosthesis versus a bioprosthetic tissue valve.  Given  the patient's age and associated coronary artery disease, I have  recommended that a bioprosthetic tissue valve would likely be best for  Mr. Mederos, and the relative risk of late structural valve  deterioration or failure during his lifetime should be very low.  We  tentatively plan to proceed with surgery on Wednesday, March 19.  All  their questions have been addressed.      Salvatore Decent. Cornelius Moras, M.D.  Electronically Signed     CHO/MEDQ  D:  12/30/2006  T:  01/01/2007  Job:  [id number]   cc:   Estanislado Pandy, MD  Jamse Mead, MD

## 2011-03-05 NOTE — Discharge Summary (Signed)
NAMEELJAY, LAVE NO.:  [id number]   MEDICAL RECORD NO.:  [id number]          PATIENT TYPE:  INP   LOCATION:  2035                         FACILITY:  [facility]   PHYSICIAN:  Salvatore Decent. Cornelius Moras, M.D. DATE OF BIRTH:  1933-12-11   DATE OF ADMISSION:  01/04/2007  DATE OF DISCHARGE:  01/18/2007                               DISCHARGE SUMMARY   ADDENDUM:  This is an addendum to the patient's discharge summary  dictated January 11, 2007.  The patient's discharge was held due to the  patient continuing to experience SVT/atrial fibrillation with  bradycardia.  The patient was started on amiodarone and Cardizem  empirically for atrial fibrillation.  EP was consulted and evaluated  patient January 05, 2007.  At that time, the patient was continued on the  amio, Cardizem, and Coumadin was added.  He was also continued on  Lopressor.  After evaluation by EP, they felt that the patient would  benefit from a dual-chamber permanent pacemaker.  This was implanted on  January 16, 2007.  The patient tolerated this procedure well.  This was  then interrogated the following day, January 17, 2007.  It was noted the  pacemaker was functioning appropriately.  The patient was noted to have  remained in normal sinus rhythm for 24+ hours straight.  He was noted  not to be a very good Coumadin candidate and with the patient remaining  in normal sinus rhythm with current medications, Coumadin was D/C'd.  Prior to discharge home, the patient was noted to be pacing and normal  sinus rhythm.  During the remainder of his hospital stay, he did develop  a persistent cough.  He was started on guaifenesin as well as Avelox.  The patient's cough did improve prior to discharge home.  Antibiotics  will be continued for their full course.  The patient's vital signs  remained stable.  He remained oxygen saturating greater than 90%.  The  patient's pulmonary status remained stable.  His incisions remained  clean,  dry and intact.   LABORATORY DATA:  Labs on January 17, 2007 showed a white count of 7.7,  hemoglobin of 9.8, hematocrit of 27.8, platelet count 341,000.  Sodium  was 131, potassium of 4.3, chloride of 94, bicarb of 27, BUN of 14,  creatinine of 1.37, glucose of 95, INR of 1.3.   DISCHARGE INSTRUCTIONS:  Patient is visibly ready for discharge home in  the AM January 18, 2007.  Please see dictated discharge summary for  discharge instructions and follow-up appointments.  A follow-up  appointment added has been arranged with Dr. Ladona Ridgel for Tuesday, May 02, 2007 at 9:40 AM.  The patient is to present to the clinic Monday,  January 30, 2007 at three o'clock PM for pacer evaluation.  EP discussed  instructions on pacemaker care.   DISCHARGE MEDICATIONS:  1. Aspirin 325 mg daily.  2. Lopressor 50 mg b.i.d.  3. Advicor 500/20 daily.  4. Amiodarone 400 mg b.i.d. times 14 days, then 200 mg b.i.d.  5. Guaifenesin 600 mg b.i.d.  6. Avelox 400 mg daily times six days.  7. Lasix 40 mg daily times seven days.  8. Potassium chloride 20 mEq daily times seven days.  9. Ultram 50 mg one to two tabs q.4-6h. p.r.n. pain.      Theda Belfast, PA      Salvatore Decent. Cornelius Moras, M.D.  Electronically Signed    KMD/MEDQ  D:  01/17/2007  T:  01/17/2007  Job:  [id number]   cc:   Doylene Canning. Ladona Ridgel, MD

## 2013-05-04 ENCOUNTER — Ambulatory Visit: Payer: Self-pay | Admitting: Specialist

## 2014-05-03 DIAGNOSIS — N400 Enlarged prostate without lower urinary tract symptoms: Secondary | ICD-10-CM | POA: Diagnosis present

## 2014-07-05 ENCOUNTER — Observation Stay (HOSPITAL_COMMUNITY)
Admission: EM | Admit: 2014-07-05 | Discharge: 2014-07-06 | Disposition: A | Discharge status: Home / Self Care | Payer: Medicare Other | Attending: Internal Medicine | Admitting: Internal Medicine

## 2014-07-05 ENCOUNTER — Emergency Department (HOSPITAL_COMMUNITY): Payer: Medicare Other

## 2014-07-05 ENCOUNTER — Encounter (HOSPITAL_COMMUNITY): Payer: Self-pay | Admitting: Emergency Medicine

## 2014-07-05 DIAGNOSIS — N183 Chronic kidney disease, stage 3 unspecified: Secondary | ICD-10-CM | POA: Diagnosis not present

## 2014-07-05 DIAGNOSIS — Z954 Presence of other heart-valve replacement: Secondary | ICD-10-CM | POA: Insufficient documentation

## 2014-07-05 DIAGNOSIS — I059 Rheumatic mitral valve disease, unspecified: Secondary | ICD-10-CM | POA: Insufficient documentation

## 2014-07-05 DIAGNOSIS — E079 Disorder of thyroid, unspecified: Secondary | ICD-10-CM | POA: Insufficient documentation

## 2014-07-05 DIAGNOSIS — N400 Enlarged prostate without lower urinary tract symptoms: Secondary | ICD-10-CM | POA: Diagnosis not present

## 2014-07-05 DIAGNOSIS — E039 Hypothyroidism, unspecified: Secondary | ICD-10-CM | POA: Insufficient documentation

## 2014-07-05 DIAGNOSIS — J301 Allergic rhinitis due to pollen: Secondary | ICD-10-CM | POA: Insufficient documentation

## 2014-07-05 DIAGNOSIS — Z79899 Other long term (current) drug therapy: Secondary | ICD-10-CM | POA: Insufficient documentation

## 2014-07-05 DIAGNOSIS — Z951 Presence of aortocoronary bypass graft: Secondary | ICD-10-CM | POA: Diagnosis not present

## 2014-07-05 DIAGNOSIS — I509 Heart failure, unspecified: Secondary | ICD-10-CM | POA: Diagnosis not present

## 2014-07-05 DIAGNOSIS — I129 Hypertensive chronic kidney disease with stage 1 through stage 4 chronic kidney disease, or unspecified chronic kidney disease: Principal | ICD-10-CM | POA: Insufficient documentation

## 2014-07-05 DIAGNOSIS — Z7982 Long term (current) use of aspirin: Secondary | ICD-10-CM | POA: Diagnosis not present

## 2014-07-05 DIAGNOSIS — E785 Hyperlipidemia, unspecified: Secondary | ICD-10-CM | POA: Diagnosis not present

## 2014-07-05 DIAGNOSIS — R0602 Shortness of breath: Secondary | ICD-10-CM | POA: Diagnosis present

## 2014-07-05 DIAGNOSIS — I2510 Atherosclerotic heart disease of native coronary artery without angina pectoris: Secondary | ICD-10-CM | POA: Insufficient documentation

## 2014-07-05 DIAGNOSIS — Z23 Encounter for immunization: Secondary | ICD-10-CM | POA: Insufficient documentation

## 2014-07-05 DIAGNOSIS — I160 Hypertensive urgency: Secondary | ICD-10-CM

## 2014-07-05 DIAGNOSIS — R112 Nausea with vomiting, unspecified: Secondary | ICD-10-CM | POA: Diagnosis present

## 2014-07-05 DIAGNOSIS — J449 Chronic obstructive pulmonary disease, unspecified: Secondary | ICD-10-CM | POA: Insufficient documentation

## 2014-07-05 DIAGNOSIS — Z87891 Personal history of nicotine dependence: Secondary | ICD-10-CM | POA: Diagnosis not present

## 2014-07-05 DIAGNOSIS — I495 Sick sinus syndrome: Secondary | ICD-10-CM | POA: Insufficient documentation

## 2014-07-05 DIAGNOSIS — E7800 Pure hypercholesterolemia, unspecified: Secondary | ICD-10-CM | POA: Diagnosis not present

## 2014-07-05 DIAGNOSIS — D509 Iron deficiency anemia, unspecified: Secondary | ICD-10-CM | POA: Insufficient documentation

## 2014-07-05 DIAGNOSIS — I10 Essential (primary) hypertension: Secondary | ICD-10-CM | POA: Insufficient documentation

## 2014-07-05 DIAGNOSIS — I359 Nonrheumatic aortic valve disorder, unspecified: Secondary | ICD-10-CM | POA: Insufficient documentation

## 2014-07-05 DIAGNOSIS — R0600 Dyspnea, unspecified: Secondary | ICD-10-CM

## 2014-07-05 DIAGNOSIS — M1990 Unspecified osteoarthritis, unspecified site: Secondary | ICD-10-CM | POA: Insufficient documentation

## 2014-07-05 HISTORY — DX: Disorder of thyroid, unspecified: E07.9

## 2014-07-05 HISTORY — DX: Sick sinus syndrome: I49.5

## 2014-07-05 HISTORY — DX: Atherosclerotic heart disease of native coronary artery without angina pectoris: I25.10

## 2014-07-05 HISTORY — DX: Benign prostatic hyperplasia without lower urinary tract symptoms: N40.0

## 2014-07-05 HISTORY — DX: Pure hypercholesterolemia, unspecified: E78.00

## 2014-07-05 HISTORY — DX: Presence of prosthetic heart valve: Z95.2

## 2014-07-05 HISTORY — DX: Essential (primary) hypertension: I10

## 2014-07-05 LAB — RAPID URINE DRUG SCREEN, HOSP PERFORMED
Amphetamines: NOT DETECTED
Barbiturates: NOT DETECTED
Benzodiazepines: NOT DETECTED
Cocaine: NOT DETECTED
Opiates: NOT DETECTED
Tetrahydrocannabinol: NOT DETECTED

## 2014-07-05 LAB — URINE MICROSCOPIC-ADD ON

## 2014-07-05 LAB — CBC
HCT: 39.5 % (ref 39.0–52.0)
Hemoglobin: 13.7 g/dL (ref 13.0–17.0)
MCH: 29.8 pg (ref 26.0–34.0)
MCHC: 34.7 g/dL (ref 30.0–36.0)
MCV: 86.1 fL (ref 78.0–100.0)
Platelets: 193 10*3/uL (ref 150–400)
RBC: 4.59 MIL/uL (ref 4.22–5.81)
RDW: 12.7 % (ref 11.5–15.5)
WBC: 6.1 10*3/uL (ref 4.0–10.5)

## 2014-07-05 LAB — COMPREHENSIVE METABOLIC PANEL
ALT: 14 U/L (ref 0–53)
AST: 25 U/L (ref 0–37)
Albumin: 3.7 g/dL (ref 3.5–5.2)
Alkaline Phosphatase: 83 U/L (ref 39–117)
Anion gap: 11 (ref 5–15)
BUN: 22 mg/dL (ref 6–23)
CO2: 25 mEq/L (ref 19–32)
Calcium: 10.6 mg/dL — ABNORMAL HIGH (ref 8.4–10.5)
Chloride: 101 mEq/L (ref 96–112)
Creatinine, Ser: 1.24 mg/dL (ref 0.50–1.35)
GFR calc Af Amer: 63 mL/min — ABNORMAL LOW (ref >90)
GFR calc non Af Amer: 54 mL/min — ABNORMAL LOW (ref >90)
Glucose, Bld: 164 mg/dL — ABNORMAL HIGH (ref 70–99)
Potassium: 5.1 mEq/L (ref 3.7–5.3)
Sodium: 137 mEq/L (ref 137–147)
Total Bilirubin: 0.4 mg/dL (ref 0.3–1.2)
Total Protein: 7.8 g/dL (ref 6.0–8.3)

## 2014-07-05 LAB — DIFFERENTIAL
Basophils Absolute: 0 10*3/uL (ref 0.0–0.1)
Basophils Relative: 0 % (ref 0–1)
Eosinophils Absolute: 0 10*3/uL (ref 0.0–0.7)
Eosinophils Relative: 0 % (ref 0–5)
Lymphocytes Relative: 12 % (ref 12–46)
Lymphs Abs: 0.7 10*3/uL (ref 0.7–4.0)
Monocytes Absolute: 0.2 10*3/uL (ref 0.1–1.0)
Monocytes Relative: 3 % (ref 3–12)
Neutro Abs: 5.2 10*3/uL (ref 1.7–7.7)
Neutrophils Relative %: 85 % — ABNORMAL HIGH (ref 43–77)

## 2014-07-05 LAB — URINALYSIS, ROUTINE W REFLEX MICROSCOPIC
Bilirubin Urine: NEGATIVE
Glucose, UA: NEGATIVE mg/dL
Hgb urine dipstick: NEGATIVE
Ketones, ur: NEGATIVE mg/dL
Nitrite: NEGATIVE
Protein, ur: NEGATIVE mg/dL
Specific Gravity, Urine: 1.02 (ref 1.005–1.030)
Urobilinogen, UA: 0.2 mg/dL (ref 0.0–1.0)
pH: 7.5 (ref 5.0–8.0)

## 2014-07-05 LAB — BASIC METABOLIC PANEL
Anion gap: 14 (ref 5–15)
BUN: 22 mg/dL (ref 6–23)
CO2: 23 mEq/L (ref 19–32)
Calcium: 10.3 mg/dL (ref 8.4–10.5)
Chloride: 102 mEq/L (ref 96–112)
Creatinine, Ser: 1.3 mg/dL (ref 0.50–1.35)
GFR calc Af Amer: 59 mL/min — ABNORMAL LOW (ref >90)
GFR calc non Af Amer: 51 mL/min — ABNORMAL LOW (ref >90)
Glucose, Bld: 133 mg/dL — ABNORMAL HIGH (ref 70–99)
Potassium: 4.6 mEq/L (ref 3.7–5.3)
Sodium: 139 mEq/L (ref 137–147)

## 2014-07-05 LAB — I-STAT TROPONIN, ED: Troponin i, poc: 0 ng/mL (ref 0.00–0.08)

## 2014-07-05 LAB — TROPONIN I: Troponin I: <0.3 ng/mL (ref <0.30)

## 2014-07-05 LAB — LIPASE, BLOOD: Lipase: 46 U/L (ref 11–59)

## 2014-07-05 LAB — PRO B NATRIURETIC PEPTIDE: Pro B Natriuretic peptide (BNP): 821.3 pg/mL — ABNORMAL HIGH (ref 0–450)

## 2014-07-05 MED ORDER — LEVOTHYROXINE SODIUM 88 MCG PO TABS
88.0000 ug | ORAL_TABLET | Freq: Every day | ORAL | Status: DC
  Administered 2014-07-06: 88 ug via ORAL
  Filled 2014-07-05 (×2): qty 1

## 2014-07-05 MED ORDER — ONDANSETRON HCL 4 MG/2ML IJ SOLN: 4.0000 mg | Freq: Four times a day (QID) | INTRAMUSCULAR | Status: DC | PRN

## 2014-07-05 MED ORDER — MULTI-VITAMIN/MINERALS PO TABS: 1.0000 | ORAL_TABLET | Freq: Every day | ORAL | Status: DC

## 2014-07-05 MED ORDER — ASPIRIN EC 325 MG PO TBEC
325.0000 mg | DELAYED_RELEASE_TABLET | Freq: Every day | ORAL | Status: DC
  Filled 2014-07-05: qty 1

## 2014-07-05 MED ORDER — FLUTICASONE PROPIONATE 50 MCG/ACT NA SUSP: 2.0000 | Freq: Every day | NASAL | Status: DC | PRN

## 2014-07-05 MED ORDER — SODIUM CHLORIDE 0.9 % IJ SOLN
3.0000 mL | Freq: Two times a day (BID) | INTRAMUSCULAR | Status: DC
  Administered 2014-07-05 – 2014-07-06 (×2): 3 mL via INTRAVENOUS

## 2014-07-05 MED ORDER — ALBUTEROL SULFATE HFA 108 (90 BASE) MCG/ACT IN AERS: 2.0000 | INHALATION_SPRAY | Freq: Four times a day (QID) | RESPIRATORY_TRACT | Status: DC | PRN

## 2014-07-05 MED ORDER — HYDRALAZINE HCL 20 MG/ML IJ SOLN
10.0000 mg | Freq: Once | INTRAMUSCULAR | Status: AC
  Administered 2014-07-05: 10 mg via INTRAVENOUS
  Filled 2014-07-05: qty 1

## 2014-07-05 MED ORDER — ACETAMINOPHEN 325 MG PO TABS: 650.0000 mg | ORAL_TABLET | ORAL | Status: DC | PRN

## 2014-07-05 MED ORDER — HEPARIN SODIUM (PORCINE) 5000 UNIT/ML IJ SOLN: 5000.0000 [IU] | Freq: Three times a day (TID) | INTRAMUSCULAR | Status: DC

## 2014-07-05 MED ORDER — ONDANSETRON HCL 4 MG/2ML IJ SOLN
4.0000 mg | INTRAMUSCULAR | Status: AC
  Administered 2014-07-05: 4 mg via INTRAVENOUS
  Filled 2014-07-05: qty 2

## 2014-07-05 MED ORDER — ASPIRIN EC 325 MG PO TBEC
325.0000 mg | DELAYED_RELEASE_TABLET | Freq: Once | ORAL | Status: AC
  Administered 2014-07-05: 325 mg via ORAL
  Filled 2014-07-05: qty 1

## 2014-07-05 MED ORDER — METOPROLOL SUCCINATE ER 50 MG PO TB24
50.0000 mg | ORAL_TABLET | Freq: Every day | ORAL | Status: DC
  Administered 2014-07-06: 50 mg via ORAL
  Filled 2014-07-05: qty 1

## 2014-07-05 MED ORDER — ALBUTEROL SULFATE (2.5 MG/3ML) 0.083% IN NEBU: 2.5000 mg | INHALATION_SOLUTION | Freq: Four times a day (QID) | RESPIRATORY_TRACT | Status: DC | PRN

## 2014-07-05 MED ORDER — DUTASTERIDE 0.5 MG PO CAPS
0.5000 mg | ORAL_CAPSULE | Freq: Every day | ORAL | Status: DC
  Administered 2014-07-06: 0.5 mg via ORAL
  Filled 2014-07-05: qty 1

## 2014-07-05 MED ORDER — ADULT MULTIVITAMIN W/MINERALS CH
1.0000 | ORAL_TABLET | Freq: Every day | ORAL | Status: DC
  Administered 2014-07-06: 1 via ORAL
  Filled 2014-07-05: qty 1

## 2014-07-05 MED ORDER — FERROUS SULFATE 325 (65 FE) MG PO TABS
325.0000 mg | ORAL_TABLET | Freq: Every day | ORAL | Status: DC
  Administered 2014-07-06: 325 mg via ORAL
  Filled 2014-07-05 (×2): qty 1

## 2014-07-05 MED ORDER — ENOXAPARIN SODIUM 40 MG/0.4ML ~~LOC~~ SOLN
40.0000 mg | SUBCUTANEOUS | Status: DC
  Administered 2014-07-05: 40 mg via SUBCUTANEOUS
  Filled 2014-07-05 (×2): qty 0.4

## 2014-07-05 MED ORDER — INFLUENZA VAC SPLIT QUAD 0.5 ML IM SUSY
0.5000 mL | PREFILLED_SYRINGE | INTRAMUSCULAR | Status: AC
  Administered 2014-07-06: 0.5 mL via INTRAMUSCULAR
  Filled 2014-07-05: qty 0.5

## 2014-07-05 NOTE — ED Provider Notes (Signed)
CSN: [id number]     Arrival date & time 07/05/14  1417 History   First MD Initiated Contact with Patient 07/05/14 1540     Chief Complaint  Patient presents with  . Hypertension  . Emesis  . Weakness     (Consider location/radiation/quality/duration/timing/severity/associated sxs/prior Treatment) HPI Glenn [name] 80 y.o. with a PMH of CAD s/p CABG, thyroid disease, HTN who presents to the ED for concern of an elevated blood pressure and N/V/D. He awoke this morning feeling nauseated. This is ongoing. It has improved compared to when it started. It is currently moderate in severity. Associated with 5 episodes of NBNB emesis today. Has also had diarrhea over the past 2 days as well, but no melena and no blood in his stool. No fever at home either. Denies any chest pain. He has baseline SOB with activity which is unchanged. His daughter reports that he appeared pale and diaphoretic with this earlier today, but this has resolved. Deny any abdominal pain, dysuria, headache. He did feel slightly lightheaded with this, but this has since resolved.   Past Medical History  Diagnosis Date  . Hypertension   . High cholesterol   . Enlarged prostate   . Thyroid disease    Past Surgical History  Procedure Laterality Date  . Coronary artery bypass graft     History reviewed. No pertinent family history. History  Substance Use Topics  . Smoking status: Never Smoker   . Smokeless tobacco: Not on file  . Alcohol Use: No    Review of Systems  All other systems reviewed and are negative.     Allergies  Review of patient's allergies indicates no known allergies.  Home Medications   Prior to Admission medications   Medication Sig Start Date End Date Taking? Authorizing Provider  aspirin EC 325 MG tablet Take 325 mg by mouth daily.   Yes Historical Provider, MD  dutasteride (AVODART) 0.5 MG capsule Take 0.5 mg by mouth daily.   Yes Historical Provider, MD  ferrous sulfate 325 (65 FE) MG  tablet Take 325 mg by mouth daily with breakfast.   Yes Historical Provider, MD  fluticasone (FLONASE) 50 MCG/ACT nasal spray Place 2 sprays into both nostrils daily as needed. 05/10/14  Yes Historical Provider, MD  Inulin (FIBER CHOICE PO) Take 2 capsules by mouth daily.   Yes Historical Provider, MD  levothyroxine (SYNTHROID, LEVOTHROID) 88 MCG tablet Take 1 tablet by mouth daily. 07/02/14  Yes Historical Provider, MD  metoprolol succinate (TOPROL-XL) 50 MG 24 hr tablet Take 1 tablet by mouth daily. 06/18/14  Yes Historical Provider, MD  Multiple Vitamins-Minerals (MULTIVITAMIN WITH MINERALS) tablet Take 1 tablet by mouth daily.   Yes Historical Provider, MD  PROAIR HFA 108 (90 BASE) MCG/ACT inhaler 2 puffs every 6 (six) hours as needed. 06/18/14  Yes Historical Provider, MD   BP 200/88  Pulse 69  Temp(Src) 98.3 F (36.8 C)  Resp 17  Wt 178 lb (80.74 kg)  SpO2 98% Physical Exam  Nursing note and vitals reviewed. Constitutional: He is oriented to person, place, and time. He appears well-developed and well-nourished. No distress.  HENT:  Head: Normocephalic and atraumatic.  Eyes: Conjunctivae and EOM are normal. Right eye exhibits no discharge. Left eye exhibits no discharge.  Neck: Normal range of motion. Neck supple. No tracheal deviation present.  Cardiovascular: Normal rate, regular rhythm and normal heart sounds.  Exam reveals no friction rub.   No murmur heard. Pulmonary/Chest: Effort normal and breath sounds  normal. No stridor. No respiratory distress. He has no wheezes. He has no rales. He exhibits no tenderness.  Abdominal: Soft. He exhibits no distension. There is no tenderness. There is no rebound and no guarding.  Musculoskeletal:       Right lower leg: He exhibits no edema.       Left lower leg: He exhibits no edema.  Neurological: He is alert and oriented to person, place, and time. No cranial nerve deficit (3-12 grossly intact bilaterally). GCS eye subscore is 4. GCS verbal  subscore is 5. GCS motor subscore is 6.  Strength in flexion and extension at the shoulders, elbows, wrists, hips, knee, and ankle 5/5 bilaterally EHL 5/5 bilaterally Grip strength 5/5 bilaterally Finger to nose, heel toe shin and rapid alternating movements intact bilaterally. Sensation over the dorsum of the hand over the 1st, second and 5th metacarpal, and the lateral forearm and lateral shoulder intact bilaterally.  Sensation over the medial and lateral malleolus, and over the 1st metatarsal intact bilaterally.  Skin: Skin is warm.  Psychiatric: He has a normal mood and affect.    ED Course  Procedures (including critical care time) Labs Review Labs Reviewed  CBC  BASIC METABOLIC PANEL  I-STAT TROPOININ, ED    Imaging Review No results found.   EKG Interpretation   Date/Time:  Friday July 05 2014 14:51:33 EDT Ventricular Rate:  66 PR Interval:  252 QRS Duration: 146 QT Interval:  480 QTC Calculation: 503 R Axis:   89 Text Interpretation:  Atrial-paced rhythm with prolonged AV conduction  Non-specific intra-ventricular conduction block Abnormal ECG Confirmed by  ZAVITZ  MD, JOSHUA ([id number]) on 07/05/2014 3:29:48 PM      MDM   Final diagnoses:  None    Pt presents with hypertension, SBP 200, and N/V/D, and SOB. NAD. No focal deficits on neuro exam. Abd exam has no focal ttp and no evidence of peritonitis. No resp distress. ECG sig for a LBBB and nonspecific ST and T wave changes - no STEMI criteria. INitial trop negative. Gave hydralazine with improvement of his BP. Given his exertional symptoms, and history of CAD with CABG - will need a ACS rule out. Admitted to the internal medicine teaching service. Care discussed with my attending, Dr. Jodi Mourning. XR neg for focal consolidation, PTX, and an unchanged previous effusion.     Sena Hitch, MD 07/06/14 [phone]

## 2014-07-05 NOTE — ED Notes (Signed)
Attempted report 

## 2014-07-05 NOTE — ED Notes (Signed)
Admitting at bedside. Pt on tele monitor. belongings with family.

## 2014-07-05 NOTE — ED Notes (Signed)
Per pt and family extremely high blood pressure, vomiting, weakness and dizziness . sts woke up feeling this way.

## 2014-07-05 NOTE — H&P (Signed)
Date: 07/05/2014               Patient Name:  Glenn [name] MRN: [id number]  DOB: 03/14/1934 Age / Sex: 80 y.o., male   PCP: Dorothey Baseman, MD              Medical Service: Internal Medicine Teaching Service              Attending Physician: Dr. Earl Lagos, MD    First Contact: Dr. Jill Alexanders Pager: [phone]  Second Contact: Dr. Dellis Anes Pager: [phone]            After Hours (After 5p/  First Contact Pager: [phone]  weekends / holidays): Second Contact Pager: [phone]   Chief Complaint:  Nausea and vomiting  History of Present Illness: Glenn [name] is a 80 year old man with history of CAD s/p CABGx3, sick sinus syndrome with atrial pacemaker, aortic and mitral valve replacements, hypothyroidism, and HTN who presents with nasuea, vomiting, and diarrhea.  Glenn [name] awoke this morning feeling nauseated and has had 5 episodes of non-bloody, non-bilious emesis.  This has continued throughout the day with only mild improvement.  He also reports one episode of diarrhea this morning, but he denies melena, blood in his stool, or abdominal pain.  He generally has constipation and takes milk of magnesia to help with this.  He denies sick contacts, but his son works at a daycare center.  He denies fevers, chills, chest pain, headache or dysuria.  He has some mild baseline SOB, which he reports is unchanged.  After vomiting this morning, he has had some blurry vision and occasional light-headedness that has resolved.  His daughter reports that he appeared pale and diaophoretic earlier today, but this has resolved.  He continues to complain of some weakness.  He says that he has had an echo in the past that showed normal heart function.  He reports a chronic pleural effusion since his heart surgery that has not resolved.  In the ER, he was noted to have a blood pressure of 200/88.  His EKG demonstrated a LBBB, unclear if new, with nonspecific ST and T wave changes.   Chest x-ray showed a chronic left-sided pleural effusion with mild pulmonary vascular congestion with no evidence of pneumonia.  Initial troponins were negative, and pro BNP was elevated at 821.3.  He was given aspirin 325 mg and hydralazine with improvement in his BP to 127/55.  Review of Systems: Review of Systems  Constitutional: Positive for malaise/fatigue. Negative for fever, chills and weight loss.  HENT: Positive for hearing loss (Age-related.). Negative for congestion and sore throat.   Eyes: Positive for blurred vision (Cataracts.).  Respiratory: Positive for shortness of breath (Chronic mild.). Negative for cough, sputum production and wheezing.   Cardiovascular: Negative for chest pain, palpitations, orthopnea, claudication, leg swelling and PND.  Gastrointestinal: Positive for nausea, vomiting and diarrhea. Negative for heartburn, abdominal pain and constipation.  Genitourinary: Negative for dysuria, hematuria and flank pain.  Musculoskeletal: Negative for joint pain and myalgias.  Skin: Negative for rash.  Neurological: Positive for dizziness and weakness. Negative for sensory change, focal weakness and headaches.  Psychiatric/Behavioral: Negative for depression.    Meds: Medications Prior to Admission  Medication Sig Dispense Refill  . aspirin EC 325 MG tablet Take 325 mg by mouth at bedtime.       . dutasteride (AVODART) 0.5 MG capsule Take 0.5 mg by mouth daily.      Marland Kitchen  ferrous sulfate 325 (65 FE) MG tablet Take 325 mg by mouth daily with breakfast.      . fluticasone (FLONASE) 50 MCG/ACT nasal spray Place 2 sprays into both nostrils daily as needed for allergies.       . Inulin (FIBER CHOICE PO) Take 2 capsules by mouth daily.      Marland Kitchen levothyroxine (SYNTHROID, LEVOTHROID) 88 MCG tablet Take 88 mcg by mouth daily.       . metoprolol succinate (TOPROL-XL) 50 MG 24 hr tablet Take 50 mg by mouth daily.       . Multiple Vitamins-Minerals (MULTIVITAMIN WITH MINERALS) tablet Take  1 tablet by mouth daily.      Marland Kitchen PROAIR HFA 108 (90 BASE) MCG/ACT inhaler Inhale 2 puffs into the lungs every 6 (six) hours as needed for shortness of breath.        Current Facility-Administered Medications  Medication Dose Route Frequency Provider Last Rate Last Dose  . acetaminophen (TYLENOL) tablet 650 mg  650 mg Oral Q4H PRN Gust Rung, DO      . albuterol (PROVENTIL HFA;VENTOLIN HFA) 108 (90 BASE) MCG/ACT inhaler 2 puff  2 puff Inhalation Q6H PRN Annett Gula, MD      . Melene Muller ON 07/06/2014] aspirin EC tablet 325 mg  325 mg Oral QHS Annett Gula, MD      . Melene Muller ON 07/06/2014] dutasteride (AVODART) capsule 0.5 mg  0.5 mg Oral Daily Annett Gula, MD      . enoxaparin (LOVENOX) injection 40 mg  40 mg Subcutaneous Q24H Annett Gula, MD      . Melene Muller ON 07/06/2014] ferrous sulfate tablet 325 mg  325 mg Oral Q breakfast Annett Gula, MD      . fluticasone [facility]) 50 MCG/ACT nasal spray 2 spray  2 spray Each Nare Daily PRN Annett Gula, MD      . heparin injection 5,000 Units  5,000 Units Subcutaneous 3 times per day Gust Rung, DO      . levothyroxine (SYNTHROID, LEVOTHROID) tablet 88 mcg  88 mcg Oral Daily Annett Gula, MD      . Melene Muller ON 07/06/2014] metoprolol succinate (TOPROL-XL) 24 hr tablet 50 mg  50 mg Oral Daily Gust Rung, DO      . multivitamin with minerals tablet 1 tablet  1 tablet Oral Daily Annett Gula, MD      . ondansetron [facility]) injection 4 mg  4 mg Intravenous Q6H PRN Gust Rung, DO      . sodium chloride 0.9 % injection 3 mL  3 mL Intravenous Q12H Annett Gula, MD        Allergies: Allergies as of 07/05/2014  . (No Known Allergies)   Past Medical History  Diagnosis Date  . Hypertension   . High cholesterol   . Enlarged prostate   . Thyroid disease   . H/O aortic valve replacement     per PCP notes in Care everywhere  . H/O mitral valve replacement     per PCP notes in Care everywhere  . Sick sinus syndrome     per PCP notes in  Care everywhere  . CAD (coronary artery disease)     s/p 3 vessel graft per PCP notes in Care everywhere   Past Surgical History  Procedure Laterality Date  . Coronary artery bypass graft     History reviewed. No pertinent family history. History   Social History  . Marital  Status: Widowed    Spouse Name: N/A    Number of Children: N/A  . Years of Education: N/A   Occupational History  . Not on file.   Social History Main Topics  . Smoking status: Former Smoker -- 1.00 packs/day for 15 years    Types: Cigarettes    Quit date: 07/06/1967  . Smokeless tobacco: Not on file  . Alcohol Use: No     Comment: Occasional use in the past.  . Drug Use: No  . Sexual Activity: Not on file   Other Topics Concern  . Not on file   Social History Narrative  . No narrative on file    Physical Exam: Filed Vitals:   07/05/14 1930  BP: 122/53  Pulse: 72  Temp:   Resp: 20   Physical Exam  Constitutional: He is oriented to person, place, and time and well-developed, well-nourished, and in no distress. No distress.  HENT:  Head: Normocephalic and atraumatic.  Eyes: Conjunctivae and EOM are normal. Pupils are equal, round, and reactive to light. No scleral icterus.  Neck: Normal range of motion. Neck supple.  Cardiovascular: Normal rate, regular rhythm and intact distal pulses.  Exam reveals no gallop and no friction rub.   Murmur (3/6 holosystolic murmur.) heard. Pulmonary/Chest: Effort normal and breath sounds normal. No respiratory distress. He has no wheezes. He has no rales.  Abdominal: Soft. Bowel sounds are normal. He exhibits no distension and no mass. There is no tenderness.  Musculoskeletal: Normal range of motion. He exhibits no edema.  Neurological: He is alert and oriented to person, place, and time. No cranial nerve deficit. He exhibits normal muscle tone. Coordination normal.  Skin: Skin is warm and dry. He is not diaphoretic. There is erythema (On R cheek.).  Chronic  sun changes.  Psychiatric: Mood and affect normal.    Lab results: Basic Metabolic Panel:  Recent Labs  16/10/96 1447  NA 139  K 4.6  CL 102  CO2 23  GLUCOSE 133*  BUN 22  CREATININE 1.30  CALCIUM 10.3   CBC:  Recent Labs  07/05/14 1447  WBC 6.1  NEUTROABS 5.2  HGB 13.7  HCT 39.5  MCV 86.1  PLT 193   BNP:  Recent Labs  07/05/14 1533  PROBNP 821.3*   Urinalysis:  Recent Labs  07/05/14 1749  COLORURINE YELLOW  LABSPEC 1.020  PHURINE 7.5  GLUCOSEU NEGATIVE  HGBUR NEGATIVE  BILIRUBINUR NEGATIVE  KETONESUR NEGATIVE  PROTEINUR NEGATIVE  UROBILINOGEN 0.2  NITRITE NEGATIVE  LEUKOCYTESUR SMALL*   Drugs of Abuse     Component Value Date/Time   LABOPIA NONE DETECTED 07/05/2014 1749   COCAINSCRNUR NONE DETECTED 07/05/2014 1749   LABBENZ NONE DETECTED 07/05/2014 1749   AMPHETMU NONE DETECTED 07/05/2014 1749   THCU NONE DETECTED 07/05/2014 1749   LABBARB NONE DETECTED 07/05/2014 1749     Imaging results:  Dg Chest 2 View  07/05/2014   CLINICAL DATA:  Vomiting and weakness; history of hypertension and multiple chest surgeries  EXAM: CHEST  2 VIEW  COMPARISON:  PA and lateral chest x-ray of Feb 20, 2007  FINDINGS: The lungs are reasonably well inflated. There is chronic pleural effusion versus thickening along the right lateral thoracic wall and also layering posteriorly. There is chronic partial obscuration of the left hemidiaphragm. The interstitial markings are coarse. The cardiac silhouette is mildly enlarged. Prosthetic aortic and mitral valve rings are present. The patient has undergone previous CABG. A permanent pacemaker is in place.  The observed bony thorax exhibits no acute abnormalities.  IMPRESSION: There is a chronic left-sided pleural effusion or pleural thickening. There is mild pulmonary vascular congestion consistent with low-grade CHF. There is no evidence of pneumonia.   Electronically Signed   By: David  [place]   On: 07/05/2014 17:37    Other  results: EKG: atrially paced with LBBB, non-specific ST and T wave changes. No prior for comparison.  Assessment & Plan by Problem: Active Problems:   Shortness of breath   #Hypertensive urgency Blood pressure elevated to 200/88 on admission associated with nausea and vomiting.  Reports compliance with his metoprolol.  BP now improved after receiving hydralazine 10 mg IV in the ER.  Likely needs additional BP meds.  Mild pulmonary vascular congestion, possibly secondary to heart strain with elevated afterload. -Admit to telemetry. -Continue home Toprol XL 50 mg daily. -Consider adding ACE inhibitor tomorrow for improved BP control. -Heart diet.  #Nausea, vomiting, diarrhea Symptoms have improved so most likely secondary to hypertension, but cannot rule out ACS or acute colitis. No sick contacts, and troponin negative.  Urinalysis shows small leukocytes, but no urinary symptoms and Hgb negative on dipstick making kidney stone and UTI unlikely.  Only one episode of diarrhea, most likely secondary to using milk of magnesia. No recent antibiotic exposure. -Orthostatics. -Zofran 4 mg q6h PRN.  #CAD s/p CABG Follows with Dr. Forde Radon of Internal Medicine and Dr. Darrold Junker of Cardiology affiliated with [facility].   Nausea and vomiting potentially ACS symptoms given cardiac history, but reports no chest pain.  Initial troponin negative, LBBB with atrial pacing on EKG.  Unclear if LBBB is new from prior as no old EKGs on file.  LBBB could mask underlying ischemia on EKG and ST segment changes on EKG are non-specific in setting of LBBB.  Heart score of 5, intermediate risk. May benefit from stress testing once blood pressure improved.  UDS negative. -Trend troponins. -Repeat EKG in the morning. -Aspirin 325 mg daily.  #Left pleural effusion and mild pulmonary congestion. Reports this is chronic since his heart surgery.  Has mild shortness of breath at baseline.  Mild pulmonary congestion, likely related  to hypertension, but may reflect some degree of heart failure.  BNP elevated at 821.3.  Likely ischemic cardiomyopathy with valvular contribution as well s/p aortic and mitral valve replacements.  Remote smoking history, but on albuterol at home so possible COPD contributing. -TTE to assess heart function. -Consider one time dose of Lasix if shortness of breath persists. -Albuterol nebs q6h PRN. -Daily weights. -Strict ins and outs.  #CKD baseline Cr 1.4 Baseline reported as 1.4 in [facility] Internal Medicine note on Care everywhere, 1.24 on admission.  CKD secondary to hypertension. -Continue to monitor Cr. -Careful with diuresis.  #Hypothyroidism -Continue home Synthroid 88 mcg daily.  #Iron-deficiency anemia Hgb normal at 13.7 on admission. -Continue home ferrous sulfate 325 mg daily.  #Seasonal allergies -Continue home Flonase.  #BPH No urinary symptoms currently. -Continue home dutasteride 0.5 mg daily. -Catheterize if unable to void.  #DVT prophylaxis -Lovenox  Dispo: Disposition is deferred at this time, awaiting improvement of current medical problems. Anticipated discharge in approximately 1-2 day(s).   The patient does have a current PCP Dorothey Baseman, MD), therefore will not be require OPC follow-up after discharge.   The patient does not have transportation limitations that hinder transportation to clinic appointments.   Signed:  Luisa Dago, MD, PhD PGY-1 Internal Medicine Teaching Service Pager: [phone] 07/05/2014, 8:52 PM

## 2014-07-06 ENCOUNTER — Encounter (HOSPITAL_COMMUNITY): Payer: Self-pay | Admitting: Nurse Practitioner

## 2014-07-06 DIAGNOSIS — R197 Diarrhea, unspecified: Secondary | ICD-10-CM

## 2014-07-06 DIAGNOSIS — N400 Enlarged prostate without lower urinary tract symptoms: Secondary | ICD-10-CM | POA: Diagnosis not present

## 2014-07-06 DIAGNOSIS — I369 Nonrheumatic tricuspid valve disorder, unspecified: Secondary | ICD-10-CM

## 2014-07-06 DIAGNOSIS — I10 Essential (primary) hypertension: Secondary | ICD-10-CM

## 2014-07-06 DIAGNOSIS — E7800 Pure hypercholesterolemia, unspecified: Secondary | ICD-10-CM | POA: Diagnosis not present

## 2014-07-06 DIAGNOSIS — I129 Hypertensive chronic kidney disease with stage 1 through stage 4 chronic kidney disease, or unspecified chronic kidney disease: Secondary | ICD-10-CM | POA: Diagnosis not present

## 2014-07-06 DIAGNOSIS — N183 Chronic kidney disease, stage 3 unspecified: Secondary | ICD-10-CM | POA: Diagnosis not present

## 2014-07-06 DIAGNOSIS — J90 Pleural effusion, not elsewhere classified: Secondary | ICD-10-CM

## 2014-07-06 DIAGNOSIS — I2510 Atherosclerotic heart disease of native coronary artery without angina pectoris: Secondary | ICD-10-CM

## 2014-07-06 DIAGNOSIS — I25810 Atherosclerosis of coronary artery bypass graft(s) without angina pectoris: Secondary | ICD-10-CM

## 2014-07-06 DIAGNOSIS — R112 Nausea with vomiting, unspecified: Secondary | ICD-10-CM

## 2014-07-06 DIAGNOSIS — I447 Left bundle-branch block, unspecified: Secondary | ICD-10-CM

## 2014-07-06 LAB — BASIC METABOLIC PANEL
Anion gap: 14 (ref 5–15)
BUN: 23 mg/dL (ref 6–23)
CO2: 22 mEq/L (ref 19–32)
Calcium: 10 mg/dL (ref 8.4–10.5)
Chloride: 100 mEq/L (ref 96–112)
Creatinine, Ser: 1.3 mg/dL (ref 0.50–1.35)
GFR calc Af Amer: 59 mL/min — ABNORMAL LOW (ref >90)
GFR calc non Af Amer: 51 mL/min — ABNORMAL LOW (ref >90)
Glucose, Bld: 112 mg/dL — ABNORMAL HIGH (ref 70–99)
Potassium: 4.1 mEq/L (ref 3.7–5.3)
Sodium: 136 mEq/L — ABNORMAL LOW (ref 137–147)

## 2014-07-06 LAB — HEMOGLOBIN A1C
Hgb A1c MFr Bld: 5.9 % — ABNORMAL HIGH (ref <5.7)
Mean Plasma Glucose: 123 mg/dL — ABNORMAL HIGH (ref <117)

## 2014-07-06 LAB — TROPONIN I
Troponin I: <0.3 ng/mL (ref <0.30)
Troponin I: <0.3 ng/mL (ref <0.30)

## 2014-07-06 LAB — TSH: TSH: 1.66 u[IU]/mL (ref 0.350–4.500)

## 2014-07-06 MED ORDER — LISINOPRIL 5 MG PO TABS
5.0000 mg | ORAL_TABLET | Freq: Every day | ORAL | Status: DC
  Filled 2014-07-06: qty 1

## 2014-07-06 MED ORDER — LISINOPRIL 5 MG PO TABS
5.0000 mg | ORAL_TABLET | Freq: Every day | ORAL | Status: DC
Start: 2014-07-06 — End: 2014-07-06

## 2014-07-06 NOTE — H&P (Signed)
Pt seen and examined. Case d/w Dr. Glenard Haring. I agree with findings and plan as outlined in his note. Please refer to my note for further details

## 2014-07-06 NOTE — Progress Notes (Signed)
Admitted pt to rm 3E26 from ED, oriented to room, call bell placed within reach, admission assessment done, orders carried out. Family at bedside. Will continue to monitor.  07/05/14 2057  Vitals  Temp 97.8 F (36.6 C)  Temp src Oral  BP ! 146/74 mmHg  BP Location Right arm  Pulse Rate 66  Pulse Rate Source Dinamap  Resp 18  Oxygen Therapy  SpO2 99 %  O2 Device None (Room air)  Height and Weight  Height 5\' 9"  (1.753 m)  Weight 78.2 kg (172 lb 6.4 oz) (scale b)  Type of Scale Used Standing  BSA (Calculated - sq m) 1.95 sq meters  BMI (Calculated) 25.5  Weight in (lb) to have BMI = 25 168.9

## 2014-07-06 NOTE — ED Provider Notes (Signed)
Medical screening examination/treatment/procedure(s) were conducted as a shared visit with non-physician practitioner(s) or resident  and myself.  I personally evaluated the patient during the encounter and agree with the findings.   I have personally reviewed any xrays and/ or EKG's with the provider and I agree with interpretation.   Patient presented with general weakness, elevated blood pressure, nausea vomiting and exertional shortness of breath. Patient feels mild improvement on reassessment. On exam very mild general weakness bilateral upper and lower extremities, mild dry mucous membranes, abdomen soft nontender, lungs clear to auscultation. Blood pressure significantly elevated on arrival, blood pressure medicines given. EKG no STEMI criteria. Observation in the hospital for further evaluation.  The patients results and plan were reviewed and discussed.   Any x-rays performed were personally reviewed by myself.   Differential diagnosis were considered with the presenting HPI.  Medications  aspirin EC tablet 325 mg (not administered)  dutasteride (AVODART) capsule 0.5 mg (not administered)  ferrous sulfate tablet 325 mg (not administered)  fluticasone (FLONASE) 50 MCG/ACT nasal spray 2 spray (not administered)  levothyroxine (SYNTHROID, LEVOTHROID) tablet 88 mcg (not administered)  enoxaparin (LOVENOX) injection 40 mg (40 mg Subcutaneous Given 07/05/14 2240)  sodium chloride 0.9 % injection 3 mL (3 mLs Intravenous Given 07/05/14 2240)  acetaminophen (TYLENOL) tablet 650 mg (not administered)  ondansetron (ZOFRAN) injection 4 mg (not administered)  metoprolol succinate (TOPROL-XL) 24 hr tablet 50 mg (not administered)  multivitamin with minerals tablet 1 tablet (not administered)  albuterol (PROVENTIL) (2.5 MG/3ML) 0.083% nebulizer solution 2.5 mg (not administered)  Influenza vac split quadrivalent PF (FLUARIX) injection 0.5 mL (not administered)  aspirin EC tablet 325 mg (325 mg Oral  Given 07/05/14 1703)  hydrALAZINE (APRESOLINE) injection 10 mg (10 mg Intravenous Given 07/05/14 1614)  ondansetron (ZOFRAN) injection 4 mg (4 mg Intravenous Given 07/05/14 1703)    Filed Vitals:   07/05/14 1830 07/05/14 1900 07/05/14 1930 07/05/14 2057  BP: 132/57 127/55 122/53 146/74  Pulse: 74 74 72 66  Temp:    97.8 F (36.6 C)  TempSrc:    Oral  Resp: 20 18 20 18   Height:    5\' 9"  (1.753 m)  Weight:    172 lb 6.4 oz (78.2 kg)  SpO2: 98% 98% 97% 99%    Final diagnoses:  None    Admission/ observation were discussed with the admitting physician, patient and/or family and they are comfortable with the plan.   High blood pressure, vomiting, Dyspnea   Enid Skeens, MD 07/06/14 [phone]

## 2014-07-06 NOTE — Discharge Summary (Signed)
INTERNAL MEDICINE ATTENDING DISCHARGE COSIGN   I evaluated the patient on the day of discharge and discussed the discharge plan with my resident team. I agree with the discharge documentation and disposition.   Glenn [name] 07/06/2014, 2:03 PM

## 2014-07-06 NOTE — Plan of Care (Signed)
Problem: Phase III Progression Outcomes Goal: Voiding independently Outcome: Progressing Voiding without difficulty.

## 2014-07-06 NOTE — Discharge Instructions (Signed)
Thank you for allowing [place] to be involved in your healthcare while you were hospitalized at [facility].   Please note that there have been changes to your home medications.  --> PLEASE LOOK AT YOUR DISCHARGE MEDICATION LIST FOR DETAILS.  Please call your PCP if you have any questions or concerns, or any difficulty getting any of your medications.  Please return to the ER if you have worsening of your symptoms or new severe symptoms arise.  Please follow up with your primary care physician, Dr. Terance Hart, in 1-2 weeks for your blood pressure.  Please follow up with your cardiologist, Dr. Darrold Junker, as needed and if your blood pressure remains elevated.  Hypertension Hypertension, commonly called high blood pressure, is when the force of blood pumping through your arteries is too strong. Your arteries are the blood vessels that carry blood from your heart throughout your body. A blood pressure reading consists of a higher number over a lower number, such as 110/72. The higher number (systolic) is the pressure inside your arteries when your heart pumps. The lower number (diastolic) is the pressure inside your arteries when your heart relaxes. Ideally you want your blood pressure below 120/80. Hypertension forces your heart to work harder to pump blood. Your arteries may become narrow or stiff. Having hypertension puts you at risk for heart disease, stroke, and other problems.  RISK FACTORS Some risk factors for high blood pressure are controllable. Others are not.  Risk factors you cannot control include:   Race. You may be at higher risk if you are African American.  Age. Risk increases with age.  Gender. Men are at higher risk than women before age 40 years. After age 19, women are at higher risk than men. Risk factors you can control include:  Not getting enough exercise or physical activity.  Being overweight.  Getting too much fat, sugar, calories, or salt in your  diet.  Drinking too much alcohol. SIGNS AND SYMPTOMS Hypertension does not usually cause signs or symptoms. Extremely high blood pressure (hypertensive crisis) may cause headache, anxiety, shortness of breath, and nosebleed. DIAGNOSIS  To check if you have hypertension, your health care provider will measure your blood pressure while you are seated, with your arm held at the level of your heart. It should be measured at least twice using the same arm. Certain conditions can cause a difference in blood pressure between your right and left arms. A blood pressure reading that is higher than normal on one occasion does not mean that you need treatment. If one blood pressure reading is high, ask your health care provider about having it checked again. TREATMENT  Treating high blood pressure includes making lifestyle changes and possibly taking medicine. Living a healthy lifestyle can help lower high blood pressure. You may need to change some of your habits. Lifestyle changes may include:  Following the DASH diet. This diet is high in fruits, vegetables, and whole grains. It is low in salt, red meat, and added sugars.  Getting at least 2 hours of brisk physical activity every week.  Losing weight if necessary.  Not smoking.  Limiting alcoholic beverages.  Learning ways to reduce stress. If lifestyle changes are not enough to get your blood pressure under control, your health care provider may prescribe medicine. You may need to take more than one. Work closely with your health care provider to understand the risks and benefits. HOME CARE INSTRUCTIONS  Have your blood pressure rechecked as directed  by your health care provider.   Take medicines only as directed by your health care provider. Follow the directions carefully. Blood pressure medicines must be taken as prescribed. The medicine does not work as well when you skip doses. Skipping doses also puts you at risk for problems.   Do not  smoke.   Monitor your blood pressure at home as directed by your health care provider. SEEK MEDICAL CARE IF:   You think you are having a reaction to medicines taken.  You have recurrent headaches or feel dizzy.  You have swelling in your ankles.  You have trouble with your vision. SEEK IMMEDIATE MEDICAL CARE IF:  You develop a severe headache or confusion.  You have unusual weakness, numbness, or feel faint.  You have severe chest or abdominal pain.  You vomit repeatedly.  You have trouble breathing. MAKE SURE YOU:   Understand these instructions.  Will watch your condition.  Will get help right away if you are not doing well or get worse. Document Released: 10/04/2005 Document Revised: 02/18/2014 Document Reviewed: 07/27/2013 [facility] Patient Information 2015 [place], [place]. This information is not intended to replace advice given to you by your health care provider. Make sure you discuss any questions you have with your health care provider.

## 2014-07-06 NOTE — Progress Notes (Signed)
Subjective:  Patient was seen and examined this morning. Patient originally presented yesterday with nausea, vomiting and one episode of diarrhea. Daughter stated he was diaphoretic and pale. This morning, patient states he is doing well and he denies any complaints. He denies headache, dizziness, diaphoresis, chest pain, shortness of breath, nausea, vomiting or any recurrent episodes of diarrhea.   Objective: Vital signs in last 24 hours: Filed Vitals:   07/05/14 1900 07/05/14 1930 07/05/14 2057 07/06/14 0505  BP: 127/55 122/53 146/74 105/48  Pulse: 74 72 66 58  Temp:   97.8 F (36.6 C) 98 F (36.7 C)  TempSrc:   Oral Oral  Resp: Height:    (1.753 m)   Weight:   78.2 kg (172 lb 6.4 oz) 77.8 kg (171 lb 8.3 oz)  SpO2: 98% 97% 99% 97%   Weight change:  No intake or output data in the 24 hours ending 07/06/14 1018 Filed Vitals:   07/05/14 1900 07/05/14 1930 07/05/14 2057 07/06/14 0505  BP: 127/55 122/53 146/74 105/48  Pulse: 74 72 66 58  Temp:   97.8 F (36.6 C) 98 F (36.7 C)  TempSrc:   Oral Oral  Resp: Height:    (1.753 m)   Weight:   78.2 kg (172 lb 6.4 oz) 77.8 kg (171 lb 8.3 oz)  SpO2: 98% 97% 99% 97%   General: Vital signs reviewed.  Patient is well-developed and well-nourished, in no acute distress and cooperative with exam.  Cardiovascular: RRR, S1 normal, S2 normal, 3/6 holosytolic murmur.  Pulmonary/Chest: Clear to auscultation bilaterally, no wheezes, rales, or rhonchi. Abdominal: Soft, non-tender, non-distended, BS +, no masses, organomegaly, or guarding present.  Musculoskeletal: No joint deformities, erythema, or stiffness, ROM full and nontender. Extremities: No lower extremity edema bilaterally,  pulses symmetric and intact bilaterally. No cyanosis or clubbing. Skin: Warm, dry and intact. No rashes or erythema. Psychiatric: Normal mood and affect. speech and behavior is normal. Cognition and memory are normal.   Lab  Results: Basic Metabolic Panel:  Recent Labs Lab 07/05/14 2128 07/06/14 0411  NA 137 136*  K 5.1 4.1  CL 101 100  CO2 25 22  GLUCOSE 164* 112*  BUN 22 23  CREATININE 1.24 1.30  CALCIUM 10.6* 10.0   Liver Function Tests:  Recent Labs Lab 07/05/14 2128  AST 25  ALT 14  ALKPHOS 83  BILITOT 0.4  PROT 7.8  ALBUMIN 3.7    Recent Labs Lab 07/05/14 2128  LIPASE 46   CBC:  Recent Labs Lab 07/05/14 1447  WBC 6.1  NEUTROABS 5.2  HGB 13.7  HCT 39.5  MCV 86.1  PLT 193   Cardiac Enzymes:  Recent Labs Lab 07/05/14 2128 07/06/14 0411  TROPONINI <0.30 <0.30   BNP:  Recent Labs Lab 07/05/14 1533  PROBNP 821.3*   Hemoglobin A1C:  Recent Labs Lab 07/05/14 2128  HGBA1C 5.9*   Urine Drug Screen: Drugs of Abuse     Component Value Date/Time   LABOPIA NONE DETECTED 07/05/2014 1749   COCAINSCRNUR NONE DETECTED 07/05/2014 1749   LABBENZ NONE DETECTED 07/05/2014 1749   AMPHETMU NONE DETECTED 07/05/2014 1749   THCU NONE DETECTED 07/05/2014 1749   LABBARB NONE DETECTED 07/05/2014 1749    Urinalysis:  Recent Labs Lab 07/05/14 1749  COLORURINE YELLOW  LABSPEC 1.020  PHURINE 7.5  GLUCOSEU NEGATIVE  HGBUR NEGATIVE  BILIRUBINUR NEGATIVE  KETONESUR NEGATIVE  PROTEINUR NEGATIVE  UROBILINOGEN 0.2  NITRITE NEGATIVE  LEUKOCYTESUR SMALL*   Studies/Results: Dg Chest 2 View  07/05/2014   CLINICAL DATA:  Vomiting and weakness; history of hypertension and multiple chest surgeries  EXAM: CHEST  2 VIEW  COMPARISON:  PA and lateral chest x-ray of Feb 20, 2007  FINDINGS: The lungs are reasonably well inflated. There is chronic pleural effusion versus thickening along the right lateral thoracic wall and also layering posteriorly. There is chronic partial obscuration of the left hemidiaphragm. The interstitial markings are coarse. The cardiac silhouette is mildly enlarged. Prosthetic aortic and mitral valve rings are present. The patient has undergone previous CABG. A  permanent pacemaker is in place. The observed bony thorax exhibits no acute abnormalities.  IMPRESSION: There is a chronic left-sided pleural effusion or pleural thickening. There is mild pulmonary vascular congestion consistent with low-grade CHF. There is no evidence of pneumonia.   Electronically Signed   By: David  [place]   On: 07/05/2014 17:37   Medications:  I have reviewed the patient's current medications. Prior to Admission:  Prescriptions prior to admission  Medication Sig Dispense Refill  . aspirin EC 325 MG tablet Take 325 mg by mouth at bedtime.       . dutasteride (AVODART) 0.5 MG capsule Take 0.5 mg by mouth daily.      . ferrous sulfate 325 (65 FE) MG tablet Take 325 mg by mouth daily with breakfast.      . fluticasone (FLONASE) 50 MCG/ACT nasal spray Place 2 sprays into both nostrils daily as needed for allergies.       . Inulin (FIBER CHOICE PO) Take 2 capsules by mouth daily.      Marland Kitchen levothyroxine (SYNTHROID, LEVOTHROID) 88 MCG tablet Take 88 mcg by mouth daily.       . metoprolol succinate (TOPROL-XL) 50 MG 24 hr tablet Take 50 mg by mouth daily.       . Multiple Vitamins-Minerals (MULTIVITAMIN WITH MINERALS) tablet Take 1 tablet by mouth daily.      Marland Kitchen PROAIR HFA 108 (90 BASE) MCG/ACT inhaler Inhale 2 puffs into the lungs every 6 (six) hours as needed for shortness of breath.        Scheduled Meds: . aspirin EC  325 mg Oral QHS  . dutasteride  0.5 mg Oral Daily  . enoxaparin (LOVENOX) injection  40 mg Subcutaneous Q24H  . ferrous sulfate  325 mg Oral Q breakfast  . Influenza vac split quadrivalent PF  0.5 mL Intramuscular Tomorrow-1000  . levothyroxine  88 mcg Oral QAC breakfast  . metoprolol succinate  50 mg Oral Daily  . multivitamin with minerals  1 tablet Oral Daily  . sodium chloride  3 mL Intravenous Q12H   Continuous Infusions:  PRN Meds:.acetaminophen, albuterol, fluticasone, ondansetron (ZOFRAN) IV Assessment/Plan:  Hypertensive Urgency: Resolved. Patient's  blood pressure was 200/88 on admission. Patient is on Toprol 50 mg daily at home. Patient received hydralazine 10 mg IV in the ED. BP has been in the 120s-140s overnight and this morning. Patient will likely need to be on additional medications at home. -Continue Metoprolol 50 mg daily -Start Lisinopril 5 mg daily -Increase Lisinopril to 10 mg daily tomorrow if BP can tolerate -Cardiac Diet  Nausea, vomiting and diarrhea: This morning, patient denies nausea and denies further episode of vomiting or diarrhea. Symptoms were likely due to hypertensive urgency. Symptoms are less likely due to a viral gastroenteritis since they have resolved after 1 day. Symptoms may be due to ACS so we will rule  out cardiac causes. Patient has a history of CAD s/p CABG in 2007. He is on ASA 325 mg daily at home. Patient follows with Dr. Forde Radon of Internal Medicine and Dr. Darrold Junker of Cardiology affiliated with [facility]. Troponins have been negative x 3. EKG showed LBBB with atrial pacing, unsure if new since we do not have priors for comparison. Patient does not complain of chest pain, but give extensive cardiac history there is concern. We have consulted cardiology and they will see the patient. Echo has been performed, and awaiting echo results. Repeat EKG this am is unchanged from prior. Orthostatics are negative. -Appreciate Cardiology recommendations -Echo results pending -ASA 325 mg daily -BMET in am  Left pleural effusion and mild pulmonary congestion on CXR: Chronic in nature. Patient admits to mild shortness of breath at baseline, but denies any sob currently or any increased sob. Mild pulmonary congestion could be secondary to hypertension with underlying ischemic cardiomyopathy. Patient has had an echo completed.   -Echo results pending -Albuterol nebs Q6H prn -Daily weights -Strict ins and outs   CKD baseline Cr 1.4: Baseline reported as 1.4 in [facility] note on Care everywhere, 1.24 on  admission. BUN/Cr this morning was 23/1.3, at baseline.  -Monitor -BMET in am  Hypothyroidism: Unsure of last TSH measurement. Patient is on Synthroid 88 mcg at home daily. -TSH -Continue home Synthroid 88 mcg daily    DVT prophylaxis: Lovenox  Dispo: Disposition is deferred at this time, awaiting improvement of current medical problems.  Anticipated discharge in approximately 1-2 day(s).   The patient does have a current PCP Dorothey Baseman, MD) and does not need an [facility] hospital follow-up appointment after discharge.  The patient does not have transportation limitations that hinder transportation to clinic appointments.  .Services Needed at time of discharge: Y = Yes, Blank = No PT:   OT:   RN:   Equipment:   Other:     LOS: 1 day   Jill Alexanders, DO PGY-1 Internal Medicine Resident Pager # [phone] 07/06/2014 10:18 AM

## 2014-07-06 NOTE — Progress Notes (Addendum)
Pt seen and examined with Dr. Senaida Ores and Dr. Shirlee Latch. Case d/w Dr. Glenard Haring. Please refer to resident note for details  In brief, 80 y/o male with PMH of CAD s/p CABG *3, sick sinus syndrome s/p atrial PM, aortic and mital valve replacements, HTN, hypothyroidism who presents with n/v, diaphoresis * 1 day Pt states that yesterday he developed n/v - approx 5-6 episodes. No hematemesis. Pt states that he also had 1 episode of diarrhea yesterday but it has since resolved. No melena/BRBPR. Daughter noted pt to be pale and diaphoretic yesterday but symptoms currently resolved. He also had 1 episode of lightheadedness yesterday which has since resolved. Pt was noted to have LBBB on EKG in ED. He was also noted to have BP of 200/88 in ED. Currently pt feels well with no new complaints. Remaining ROS negative  Exam: Cardio: RRR. 3/6 holosystolic murmur + Lungs: CTA b/l Abd: soft, non tender, BS + Ext: no pedal edema Gen: AAO*3, NAD  Assessment and Plan: 80 y/o male with extensive cardiac history with n/v, LBBB on EKG possibly ischemic and hypertensive urgency  Hypertensive urgency: - BP now improved. C/w metoprolol, started on lisinopril today - Elevated BP likely secondary to n/v and possibly inability to take meds yesterday - Will monitor  CAD with EKG changes: - CE negative. Repeat EKG with LBBB - No old EKG available for comparison - Will get cardio eval. Check 2 D ECHO - Will consider myoview. Will d/w cardio - c/w asa  GI: - n/v resolved. Symptoms possibly secondary to gastroenteritis vv/s cardiac etiology - Will continue with current cardiac w/u. No further testing for now nless symptoms recur  CKD: - Creatinine at baseline (1.4 is baseline) - Will monitor  Hypothyroidism: - check TSH. C/w synthroid  Cardio note reviewed. Per cardio LBBB is old and has been worked up. C/w current cardiac meds. No further w/u at this time Symptoms likely GI related. If no recurrent symptoms today  pt stable for d/c home today

## 2014-07-06 NOTE — Discharge Summary (Signed)
INTERNAL MEDICINE ATTENDING DISCHARGE COSIGN   I evaluated the patient on the day of discharge and discussed the discharge plan with my resident team. I agree with the discharge documentation and disposition.   Kea Callan 07/06/2014, 4:38 PM

## 2014-07-06 NOTE — Discharge Summary (Addendum)
Name: Glenn [name] MRN: [id number] DOB: 1934-05-29 80 y.o. PCP: Glenn Baseman, MD  Date of Admission: 07/05/2014  3:21 PM Date of Discharge: 07/06/2014 Attending Physician: Glenn Lagos, MD  Discharge Diagnosis:  Principal Problem:   Hypertensive urgency Active Problems:   BPH (benign prostatic hypertrophy)   CHF with unknown LVEF   HLD (hyperlipidemia)   CKD (chronic kidney disease) stage 3, GFR 30-59 ml/min  Discharge Medications:   Medication List         aspirin EC 325 MG tablet  Take 325 mg by mouth at bedtime.     dutasteride 0.5 MG capsule  Commonly known as:  AVODART  Take 0.5 mg by mouth daily.     ferrous sulfate 325 (65 FE) MG tablet  Take 325 mg by mouth daily with breakfast.     FIBER CHOICE PO  Take 2 capsules by mouth daily.     fluticasone 50 MCG/ACT nasal spray  Commonly known as:  FLONASE  Place 2 sprays into both nostrils daily as needed for allergies.     levothyroxine 88 MCG tablet  Commonly known as:  SYNTHROID, LEVOTHROID  Take 88 mcg by mouth daily.     metoprolol succinate 50 MG 24 hr tablet  Commonly known as:  TOPROL-XL  Take 50 mg by mouth daily.     multivitamin with minerals tablet  Take 1 tablet by mouth daily.     PROAIR HFA 108 (90 BASE) MCG/ACT inhaler  Generic drug:  albuterol  Inhale 2 puffs into the lungs every 6 (six) hours as needed for shortness of breath.        Disposition and follow-up:   Mr.Glenn [name] was discharged from [facility] in Good condition.  At the hospital follow up visit please address:  1.  Hypertensive Urgency: Please follow up on patient's blood pressure. We continued his Toprol 50 mg daily. Patient may need to medications changed based on his daily blood pressure trends.   2.  Labs / imaging needed at time of follow-up: None  3.  Pending labs/ test needing follow-up: None  Follow-up Appointments: Follow-up Information   Follow up with BRONSTEIN,DAVID,  MD In 1 week.   Specialty:  Family Medicine   Contact information:   [address], [address] [place] [address] [phone]       Follow up with Glenn Millard, MD. (As needed)    Specialty:  Internal Medicine   Contact information:   [address] [place] [address] [phone]       Discharge Instructions: Discharge Instructions   Call MD for:  persistant dizziness or light-headedness    Complete by:  As directed      Call MD for:  persistant nausea and vomiting    Complete by:  As directed      Diet - low sodium heart healthy    Complete by:  As directed      Increase activity slowly    Complete by:  As directed            Consultations:    Procedures Performed:  Dg Chest 2 View  07/05/2014   CLINICAL DATA:  Vomiting and weakness; history of hypertension and multiple chest surgeries  EXAM: CHEST  2 VIEW  COMPARISON:  PA and lateral chest x-ray of Feb 20, 2007  FINDINGS: The lungs are reasonably well inflated. There is chronic pleural effusion versus thickening along the right lateral thoracic wall  and also layering posteriorly. There is chronic partial obscuration of the left hemidiaphragm. The interstitial markings are coarse. The cardiac silhouette is mildly enlarged. Prosthetic aortic and mitral valve rings are present. The patient has undergone previous CABG. A permanent pacemaker is in place. The observed bony thorax exhibits no acute abnormalities.  IMPRESSION: There is a chronic left-sided pleural effusion or pleural thickening. There is mild pulmonary vascular congestion consistent with low-grade CHF. There is no evidence of pneumonia.   Electronically Signed   By: Glenn [name]   On: 07/05/2014 17:37   2D Echo: Systolic function was normal. The estimated ejection fraction was in the range of 60% to 65%.  Admission HPI: Glenn [name] is a 80 year old man with history of CAD s/p CABGx3, sick sinus syndrome with atrial  pacemaker, aortic and mitral valve replacements, hypothyroidism, and HTN who presents with nasuea, vomiting, and diarrhea.  [name] awoke this morning feeling nauseated and has had 5 episodes of non-bloody, non-bilious emesis. This has continued throughout the day with only mild improvement. He also reports one episode of diarrhea this morning, but he denies melena, blood in his stool, or abdominal pain. He generally has constipation and takes milk of magnesia to help with this. He denies sick contacts, but his son works at a daycare center. He denies fevers, chills, chest pain, headache or dysuria. He has some mild baseline SOB, which he reports is unchanged. After vomiting this morning, he has had some blurry vision and occasional light-headedness that has resolved. His daughter reports that he appeared pale and diaophoretic earlier today, but this has resolved. He continues to complain of some weakness. He says that he has had an echo in the past that showed normal heart function. He reports a chronic pleural effusion since his heart surgery that has not resolved.  In the ER, he was noted to have a blood pressure of 200/88. His EKG demonstrated a LBBB, unclear if new, with nonspecific ST and T wave changes. Chest x-ray showed a chronic left-sided pleural effusion with mild pulmonary vascular congestion with no evidence of pneumonia. Initial troponins were negative, and pro BNP was elevated at 821.3. He was given aspirin 325 mg and hydralazine with improvement in his BP to 127/55.  Hospital Course by problem list: Principal Problem:   Hypertensive urgency Active Problems:   BPH (benign prostatic hypertrophy)   CHF with unknown LVEF   HLD (hyperlipidemia)   CKD (chronic kidney disease) stage 3, GFR 30-59 ml/min   Hypertensive urgency: Patient's blood pressure on admission was elevated to 200/88 and associated with nausea and vomiting. Patient reported compliance with his metoprolol. However,  patient vomited shortly after taking his medications. Patient received hydralazine 10 mg IV in the ER and his blood pressure improved and remained normotensive. Patient likely needs additional BP medications. CXR showed mild pulmonary vascular congestion, possibly secondary to heart strain with elevated afterload. Patient was admitted to telemetry and BP monitored. He remained mostly normotensive with a few readings with the systolics in the 140s-150s. We discharged him home on his Toprol XL 50 mg daily. Patient should follow up with his PCP, Dr. Terance [name], in one week to reevaluate blood pressure and medications.   Nausea, vomiting, diarrhea: Patient presented with nausea, multiple episodes of vomiting and one episode of diarrhea. Symptoms improved after admission and resolution of blood pressure so symptoms are most likely secondary to hypertension. Symptoms could have been secondary to ACS and patient had a LBBB on EKG  that we were not able to compare with priors. Cardiology was consulted who did confirm that the LBBB was not new and had been worked up previously in the past. Patient had negative troponins and no new EKG changes so he was stable for discharge from a cardiology standpoint. The day after discharge, patient stated his nausea, vomiting and diarrhea had completely resolved. Vital signs and labs were within normal limits, no leukocytosis.   CAD s/p CABG: Patient has history of CAD s/p CABG and follows with Dr. Forde Radon of Internal Medicine and Dr. Darrold Junker of Cardiology affiliated with [facility]. Patient had negative troponins, no new EKG changes and had no complaints of chest pain or shortness of breath. Repeat EKG was unchanged. We continued him on his home dose of ASA 325 mg daily.   Left pleural effusion and mild pulmonary congestion on CXR: Chronic in nature. Patient admits to mild shortness of breath at baseline, but denies any sob currently or any increased sob. Mild pulmonary congestion  could be secondary to hypertension with underlying ischemic cardiomyopathy. Patient has had an echo completed which showed systolic function was normal. The estimated ejection fraction was in the range of 60% to 65%. BNP elevated at 821.3.   Discharge Vitals:   BP 151/73  Pulse 65  Temp(Src) 97.7 F (36.5 C) (Oral)  Resp 18  Ht 5\' 9"  (1.753 m)  Wt 77.8 kg (171 lb 8.3 oz)  BMI 25.32 kg/m2  SpO2 99%  Discharge Labs:  Results for orders placed during the hospital encounter of 07/05/14 (from the past 24 hour(s))  CBC     Status: None   Collection Time    07/05/14  2:47 PM      Result Value Ref Range   WBC 6.1  4.0 - 10.5 K/uL   RBC 4.59  4.22 - 5.81 MIL/uL   Hemoglobin 13.7  13.0 - 17.0 g/dL   HCT 75.7  97.2 - 82.0 %   MCV 86.1  78.0 - 100.0 fL   MCH 29.8  26.0 - 34.0 pg   MCHC 34.7  30.0 - 36.0 g/dL   RDW 60.1  56.1 - 53.7 %   Platelets 193  150 - 400 K/uL  BASIC METABOLIC PANEL     Status: Abnormal   Collection Time    07/05/14  2:47 PM      Result Value Ref Range   Sodium 139  137 - 147 mEq/L   Potassium 4.6  3.7 - 5.3 mEq/L   Chloride 102  96 - 112 mEq/L   CO2 23  19 - 32 mEq/L   Glucose, Bld 133 (*) 70 - 99 mg/dL   BUN 22  6 - 23 mg/dL   Creatinine, Ser 9.43  0.50 - 1.35 mg/dL   Calcium 27.6  8.4 - 14.7 mg/dL   GFR calc non Af Amer 51 (*) >90 mL/min   GFR calc Af Amer 59 (*) >90 mL/min   Anion gap 14  5 - 15  DIFFERENTIAL     Status: Abnormal   Collection Time    07/05/14  2:47 PM      Result Value Ref Range   Neutrophils Relative % 85 (*) 43 - 77 %   Neutro Abs 5.2  1.7 - 7.7 K/uL   Lymphocytes Relative 12  12 - 46 %   Lymphs Abs 0.7  0.7 - 4.0 K/uL   Monocytes Relative 3  3 - 12 %   Monocytes Absolute 0.2  0.1 -  1.0 K/uL   Eosinophils Relative 0  0 - 5 %   Eosinophils Absolute 0.0  0.0 - 0.7 K/uL   Basophils Relative 0  0 - 1 %   Basophils Absolute 0.0  0.0 - 0.1 K/uL  PRO B NATRIURETIC PEPTIDE     Status: Abnormal   Collection Time    07/05/14  3:33 PM        Result Value Ref Range   Pro B Natriuretic peptide (BNP) 821.3 (*) 0 - 450 pg/mL  I-STAT TROPOININ, ED     Status: None   Collection Time    07/05/14  3:47 PM      Result Value Ref Range   Troponin i, poc 0.00  0.00 - 0.08 ng/mL   Comment 3           URINALYSIS, ROUTINE W REFLEX MICROSCOPIC     Status: Abnormal   Collection Time    07/05/14  5:49 PM      Result Value Ref Range   Color, Urine YELLOW  YELLOW   APPearance HAZY (*) CLEAR   Specific Gravity, Urine 1.020  1.005 - 1.030   pH 7.5  5.0 - 8.0   Glucose, UA NEGATIVE  NEGATIVE mg/dL   Hgb urine dipstick NEGATIVE  NEGATIVE   Bilirubin Urine NEGATIVE  NEGATIVE   Ketones, ur NEGATIVE  NEGATIVE mg/dL   Protein, ur NEGATIVE  NEGATIVE mg/dL   Urobilinogen, UA 0.2  0.0 - 1.0 mg/dL   Nitrite NEGATIVE  NEGATIVE   Leukocytes, UA SMALL (*) NEGATIVE  URINE RAPID DRUG SCREEN (HOSP PERFORMED)     Status: None   Collection Time    07/05/14  5:49 PM      Result Value Ref Range   Opiates NONE DETECTED  NONE DETECTED   Cocaine NONE DETECTED  NONE DETECTED   Benzodiazepines NONE DETECTED  NONE DETECTED   Amphetamines NONE DETECTED  NONE DETECTED   Tetrahydrocannabinol NONE DETECTED  NONE DETECTED   Barbiturates NONE DETECTED  NONE DETECTED  URINE MICROSCOPIC-ADD ON     Status: Abnormal   Collection Time    07/05/14  5:49 PM      Result Value Ref Range   Squamous Epithelial / LPF FEW (*) RARE   WBC, UA 3-6  <3 WBC/hpf   Bacteria, UA RARE  RARE   Casts HYALINE CASTS (*) NEGATIVE   Urine-Other AMORPHOUS URATES/PHOSPHATES    TROPONIN I     Status: None   Collection Time    07/05/14  9:28 PM      Result Value Ref Range   Troponin I <0.30  <0.30 ng/mL  HEMOGLOBIN A1C     Status: Abnormal   Collection Time    07/05/14  9:28 PM      Result Value Ref Range   Hemoglobin A1C 5.9 (*) <5.7 %   Mean Plasma Glucose 123 (*) <117 mg/dL  COMPREHENSIVE METABOLIC PANEL     Status: Abnormal   Collection Time    07/05/14  9:28 PM       Result Value Ref Range   Sodium 137  137 - 147 mEq/L   Potassium 5.1  3.7 - 5.3 mEq/L   Chloride 101  96 - 112 mEq/L   CO2 25  19 - 32 mEq/L   Glucose, Bld 164 (*) 70 - 99 mg/dL   BUN 22  6 - 23 mg/dL   Creatinine, Ser 8.11  0.50 - 1.35 mg/dL   Calcium 91.4 (*)  8.4 - 10.5 mg/dL   Total Protein 7.8  6.0 - 8.3 g/dL   Albumin 3.7  3.5 - 5.2 g/dL   AST 25  0 - 37 U/L   ALT 14  0 - 53 U/L   Alkaline Phosphatase 83  39 - 117 U/L   Total Bilirubin 0.4  0.3 - 1.2 mg/dL   GFR calc non Af Amer 54 (*) >90 mL/min   GFR calc Af Amer 63 (*) >90 mL/min   Anion gap 11  5 - 15  LIPASE, BLOOD     Status: None   Collection Time    07/05/14  9:28 PM      Result Value Ref Range   Lipase 46  11 - 59 U/L  TROPONIN I     Status: None   Collection Time    07/06/14  4:11 AM      Result Value Ref Range   Troponin I <0.30  <0.30 ng/mL  BASIC METABOLIC PANEL     Status: Abnormal   Collection Time    07/06/14  4:11 AM      Result Value Ref Range   Sodium 136 (*) 137 - 147 mEq/L   Potassium 4.1  3.7 - 5.3 mEq/L   Chloride 100  96 - 112 mEq/L   CO2 22  19 - 32 mEq/L   Glucose, Bld 112 (*) 70 - 99 mg/dL   BUN 23  6 - 23 mg/dL   Creatinine, Ser 1.61  0.50 - 1.35 mg/dL   Calcium 09.6  8.4 - 04.5 mg/dL   GFR calc non Af Amer 51 (*) >90 mL/min   GFR calc Af Amer 59 (*) >90 mL/min   Anion gap 14  5 - 15  TROPONIN I     Status: None   Collection Time    07/06/14  9:28 AM      Result Value Ref Range   Troponin I <0.30  <0.30 ng/mL    Signed: Jill Alexanders, DO PGY-1 Internal Medicine Resident Pager # [phone] 07/06/2014 2:21 PM

## 2014-07-06 NOTE — Consult Note (Addendum)
CARDIOLOGY CONSULT NOTE   Patient ID: Glenn [name] MRN: [id number] DOB/AGE: [birth year] 80 y.o.  Admit Date: 07/05/2014 Referring Physician: Teaching Service  Primary Physician: Juluis Pitch, MD Consulting Cardiologist: Croituro MD Primary Cardiologist: Paraschos (affliliated [facility]).  Reason for Consultation: LBBB, Hx of CAD   Clinical Summary Glenn [name] is a 80 y.o.male admitted by Internal Medicine Teaching Service with NV diarrhea, hypertension, with EKG demonstrating LBBB with non-specific T-wave changes She was also found to have a chronic left-sided pleural effusion with mild vascular congestion.   Per notes reviewed from "Care Everywhere" he has a history of CAD, Hypertension, CHF, mitral valve disorders, PPM, and COPD. He was last seen by Dr.Paraschos  on 04/02/2014 for cardiac evaluation, and has device check remotely two months ago.   In ER he was found to be hypertensive with BP 172/75, rising up to 200/88. He was treated with ASA and hydralazine 10 mg IV X1. He was placed back on his usual mediations.  He began to have symptoms yesterday am with sudden onset of NV and diarrhea after awakening.  A [employer] delivery person arrival and stated that he "looked bad."  Daughter brought him to ER at [facility] as he had his CABG surgery here. She states that he had mopped his floor the day before and spilled water requiring extra exertion to clean up the mess. She was concerned this precipitated the GI symptoms and thought it was related to his cardiac issues. The patient states that a GI bug has been "going around."  He is currently without complaints. No further GI symptoms. Did not endorse chest pain or dyspnea. Review of labs demonstrate negative troponin levels. He had mild hyperkalemia on admission which is now trending downward.    No Known Allergies  Medications Scheduled Medications: . aspirin EC  325 mg Oral QHS  . dutasteride  0.5 mg Oral Daily  .  enoxaparin (LOVENOX) injection  40 mg Subcutaneous Q24H  . ferrous sulfate  325 mg Oral Q breakfast  . levothyroxine  88 mcg Oral QAC breakfast  . lisinopril  5 mg Oral Daily  . metoprolol succinate  50 mg Oral Daily  . multivitamin with minerals  1 tablet Oral Daily  . sodium chloride  3 mL Intravenous Q12H    Infusions:    PRN Medications: acetaminophen, albuterol, fluticasone, ondansetron (ZOFRAN) IV   Past Medical History  Diagnosis Date  . Hypertension   . High cholesterol   . Enlarged prostate   . Thyroid disease   . H/O aortic valve replacement     a. 12/2006 Tissue AVR @ time of CABG.  . H/O mitral valve replacement     a. 12/2006 MV repair @ time of CABG.  . Sick sinus syndrome     a. 12/2006 s/p MDT dual chamber PPM.  . CAD (coronary artery disease)     a. 12/2006 CABG x 3: LIMA->LAD, VG->PDA, VG->OM.    Past Surgical History  Procedure Laterality Date  . Coronary artery bypass graft    . Pacemaker insertion  2008  . Aov  replacement    . Medial partial knee replacement    . Turp vaporization      Family History  Problem Relation Age of Onset  . Heart attack Father   . Hypertension Father   . Stroke Mother     Social History Glenn [name] reports that he quit smoking about 47 years ago. His smoking use included Cigarettes. He has  a 15 pack-year smoking history. He does not have any smokeless tobacco history on file. Glenn [name] reports that he does not drink alcohol.  Review of Systems Otherwise reviewed and negative except as outlined.  Physical Examination Blood pressure 151/73, pulse 65, temperature 97.7 F (36.5 C), temperature source Oral, resp. rate 18, height _0  (1.753 m), weight 171 lb 8.3 oz (77.8 kg), SpO2 99.00%. No intake or output data in the 24 hours ending 07/06/14 1234  Telemetry: NSR with LBBB  GEN: Resting comfortably. No acute distress.  HEENT: Conjunctiva and lids normal, oropharynx clear with moist mucosa. Neck: Supple, no  elevated JVP or carotid bruits, no thyromegaly. Lungs: Clear to auscultation, nonlabored breathing at rest. Cardiac: Regular rate and rhythm, 2/6 systolic murmur at the apex and RSB, no pericardial rub. Abdomen: Soft, nontender, no hepatomegaly, bowel sounds present, no guarding or rebound. Extremities: No pitting edema, distal pulses 2+. Skin: Warm and dry. Musculoskeletal: No kyphosis. Neuropsychiatric: Alert and oriented x3, affect grossly appropriate.  Prior Cardiac Testing/Procedures 1. PPM implant  Lab Results  Basic Metabolic Panel:  Recent Labs Lab 07/05/14 1447 07/05/14 2128 07/06/14 0411  NA 139 137 136*  K 4.6 5.1 4.1  CL 102 101 100  CO2 _1 GLUCOSE 133* 164* 112*  BUN _2 CREATININE 1.30 1.24 1.30  CALCIUM 10.3 10.6* 10.0    Liver Function Tests:  Recent Labs Lab 07/05/14 2128  AST 25  ALT 14  ALKPHOS 83  BILITOT 0.4  PROT 7.8  ALBUMIN 3.7    CBC:  Recent Labs Lab 07/05/14 1447  WBC 6.1  NEUTROABS 5.2  HGB 13.7  HCT 39.5  MCV 86.1  PLT 193    Cardiac Enzymes:   Radiology: Dg Chest 2 View  07/05/2014   CLINICAL DATA:  Vomiting and weakness; history of hypertension and multiple chest surgeries  EXAM: CHEST  2 VIEW  COMPARISON:  PA and lateral chest x-ray of Feb 20, 2007  FINDINGS: The lungs are reasonably well inflated. There is chronic pleural effusion versus thickening along the right lateral thoracic wall and also layering posteriorly. There is chronic partial obscuration of the left hemidiaphragm. The interstitial markings are coarse. The cardiac silhouette is mildly enlarged. Prosthetic aortic and mitral valve rings are present. The patient has undergone previous CABG. A permanent pacemaker is in place. The observed bony thorax exhibits no acute abnormalities.  IMPRESSION: There is a chronic left-sided pleural effusion or pleural thickening. There is mild pulmonary vascular congestion consistent with low-grade CHF. There is no  evidence of pneumonia.   Electronically Signed   By: David  Martinique   On: 07/05/2014 17:37     ECG: LBBB with lst degree block. Rate of 65 bpm    Impression and Recommendations  1. CAD: Known history of CABG 3 vessel in 2008, with AoV and MV repair at that time. EKG revealing LBBB which is likely not a new finding.This was seen by PCP in 2011 office note by PCP.  He is followed by Dr. Saralyn Pilar in [place] who is associated with [facility]. Cardiac markers are negative arguing against ACS. Echo is pending. He is completely asymptomatic from a cardiac standpoint his stable with no planned cardiac testing. Will have him follow up with cardiologist in [facility] as OP unless echo is significantly abnormal. Requested records from [facility] via "[facility]" for review.   2. Hypertension: Patient has been compliant with medications but began vomiting shortly after taking them. He is  better controlled now on IV hydralazine. Would restart home mediations when he is able to take in po. NVD have stopped. Medications include metoprolol 50 mg daily. On review of his records which are available. I do not see him on ACE. Creatinine is 1.30 this am.   3. SSS s/p Pacemaker placement: Remotely checked within the last 2 months.       Signed: Phill Myron. Lawrence NP  07/06/2014, 12:34 PM Co-Sign MD   Attending Note:   The patient was seen and examined.  Agree with assessment and plan as noted above.  Changes made to the above note as needed.  We were asked to comment on his LBBB and his cardiac surgery.  We have found notes indicating that the LBBB is old.  He has a cardiologist in [place] and this has been fully evaluated.  His troponin levels are negative.  No indication that his N/V was related to any cardiac condition.   His elevated BP was likely due to the vomitting.   Resume meds. I've asked the daughter to call Dr. Josefa Half if the BP remains elevated.  No changes needed in his medical regimine  at this time.  An echo was ordered.  In talking to the patient, he had an echo just 2-3 months ago at his cardiologist's office.    He may eat.  He should be able to go home today if he feels OK  We will sign off.  Call for questions.   Thayer Headings, Brooke Bonito., MD, [facility] 07/06/2014, 12:52 PM [address]  [place] Pager [phone]

## 2014-07-06 NOTE — Progress Notes (Signed)
  Echocardiogram 2D Echocardiogram has been performed.  Vickie Ponds FRANCES 07/06/2014, 10:03 AM

## 2014-07-06 NOTE — Progress Notes (Signed)
Verbalized understanding of discharge instructions.  Released.  IV site discontinued.

## 2014-07-21 ENCOUNTER — Other Ambulatory Visit (HOSPITAL_COMMUNITY): Payer: Self-pay | Admitting: Internal Medicine

## 2014-07-31 ENCOUNTER — Other Ambulatory Visit (HOSPITAL_COMMUNITY): Payer: Self-pay | Admitting: Internal Medicine

## 2014-08-05 ENCOUNTER — Other Ambulatory Visit (HOSPITAL_COMMUNITY): Payer: Self-pay | Admitting: Internal Medicine

## 2014-08-06 ENCOUNTER — Other Ambulatory Visit (HOSPITAL_COMMUNITY): Payer: Self-pay | Admitting: Internal Medicine

## 2014-12-12 ENCOUNTER — Ambulatory Visit: Payer: Self-pay | Admitting: Podiatry

## 2015-03-25 ENCOUNTER — Encounter: Payer: Self-pay | Admitting: *Deleted

## 2015-03-25 ENCOUNTER — Ambulatory Visit (INDEPENDENT_AMBULATORY_CARE_PROVIDER_SITE_OTHER): Payer: Medicare Other | Admitting: Urology

## 2015-03-25 ENCOUNTER — Other Ambulatory Visit: Payer: Self-pay | Admitting: *Deleted

## 2015-03-25 VITALS — BP 111/68 | HR 80 | Resp 18 | Wt 170.0 lb

## 2015-03-25 DIAGNOSIS — N138 Other obstructive and reflux uropathy: Secondary | ICD-10-CM

## 2015-03-25 DIAGNOSIS — N401 Enlarged prostate with lower urinary tract symptoms: Secondary | ICD-10-CM

## 2015-03-25 DIAGNOSIS — J449 Chronic obstructive pulmonary disease, unspecified: Secondary | ICD-10-CM | POA: Insufficient documentation

## 2015-03-25 NOTE — Progress Notes (Signed)
03/25/2015 12:19 AM   Glenn [name] 11/13/1933 [id number]  Referring provider: Dorothey Baseman, MD [id number] [place], [place] [address]  Chief Complaint  Patient presents with  . Follow-up    BPH    HPI: Glenn [name] is a 81 year old white male with a history of elevated PSA and BPH with LUTS controlled with Avodart who presents today for his 6 month follow-up.  IPSS is 6/1.  His urinary symptoms consist of frequency, intermittency, weak stream and nocturia. His symptoms are mild and he is pleased with his urinary symptoms.  He denies any gross hematuria, dysuria, suprapubic pain, fever, chills, nausea or vomiting. PSA History:    1.4 ng/mL on 08/29/2012    1.7 ng/mL on 02/26/2013    1.6 ng/mL on 08/29/2013    1.3 ng/mL on 03/22/2014    1.2 ng/mL on 09/23/2014   PMH: Past Medical History  Diagnosis Date  . Hypertension   . High cholesterol   . Enlarged prostate   . Thyroid disease   . H/O aortic valve replacement     a. 12/2006 Tissue AVR @ time of CABG.  . H/O mitral valve replacement     a. 12/2006 MV repair @ time of CABG.  . Sick sinus syndrome     a. 12/2006 s/p MDT dual chamber PPM.  . CAD (coronary artery disease)     a. 12/2006 CABG x 3: LIMA->LAD, VG->PDA, VG->OM.  Marland Kitchen Elevated PSA   . Constipation     Surgical History: Past Surgical History  Procedure Laterality Date  . Coronary artery bypass graft    . Pacemaker insertion  2008  . Aov  replacement    . Medial partial knee replacement  2011  . Turp vaporization  2010  . Intraocular lens insertion  2007    Home Medications:    Medication List       This list is accurate as of: 03/25/15 11:59 PM.  Always use your most recent med list.               Aclidinium Bromide 400 MCG/ACT Aepb  Inhale into the lungs.     aspirin EC 325 MG tablet  Take 325 mg by mouth at bedtime.     dutasteride 0.5 MG capsule  Commonly known as:  AVODART  Take 0.5 mg by mouth daily.     ferrous sulfate 325  (65 FE) MG tablet  Take 325 mg by mouth daily with breakfast.     FIBER CHOICE PO  Take 2 capsules by mouth daily.     fluticasone 50 MCG/ACT nasal spray  Commonly known as:  FLONASE  Place 2 sprays into both nostrils daily as needed for allergies.     levothyroxine 88 MCG tablet  Commonly known as:  SYNTHROID, LEVOTHROID  Take 88 mcg by mouth daily.     levothyroxine 75 MCG tablet  Commonly known as:  SYNTHROID, LEVOTHROID     lisinopril 5 MG tablet  Commonly known as:  PRINIVIL,ZESTRIL     metoprolol succinate 50 MG 24 hr tablet  Commonly known as:  TOPROL-XL  Take 50 mg by mouth daily.     MULTI-VITAMINS Tabs  Take by mouth.     PROAIR HFA 108 (90 BASE) MCG/ACT inhaler  Generic drug:  albuterol  Inhale 2 puffs into the lungs every 6 (six) hours as needed for shortness of breath.        Allergies: No Known Allergies  Family History: Family  History  Problem Relation Age of Onset  . Heart attack Father   . Hypertension Father   . Stroke Mother   . Cancer Neg Hx     Kidney, Bladder, Prostate    Social History:  reports that he quit smoking about 47 years ago. His smoking use included Cigarettes. He has a 15 pack-year smoking history. He does not have any smokeless tobacco history on file. He reports that he does not drink alcohol or use illicit drugs.  ROS: Urological Symptom Review  Patient is experiencing the following symptoms: Frequent urination Get up at night to urinate Weak stream   Review of Systems  Gastrointestinal (upper)  : Negative for upper GI symptoms  Gastrointestinal (lower) : Negative for lower GI symptoms  Constitutional : Negative for symptoms  Skin: Negative for skin symptoms  Eyes: Negative for eye symptoms  Ear/Nose/Throat : Negative for Ear/Nose/Throat symptoms  Hematologic/Lymphatic: Negative for Hematologic/Lymphatic symptoms  Cardiovascular : Negative for cardiovascular symptoms  Respiratory : Negative for  respiratory symptoms  Endocrine: Negative for endocrine symptoms  Musculoskeletal: Negative for musculoskeletal symptoms  Neurological: Negative for neurological symptoms  Psychologic: Negative for psychiatric symptoms   Physical Exam: BP 111/68 mmHg  Pulse 80  Resp 18  Wt 170 lb (77.111 kg)  GU: Patient with an uncircumcised phallus. Foreskin easily retracted. Some mild adhesions to the coronal surface.  Urethral meatus is patent.  No penile discharge. No penile lesions or rashes. Scrotum without lesions, cysts, rashes and/or edema.  Testicles are located scrotally bilaterally. No masses are appreciated in the testicles. Left and right epididymis are normal. Left varicocele is noted.  Rectal: Patient with  normal sphincter tone. Perineum without scarring or rashes. No rectal masses are appreciated. Prostate is approximately 55 grams, small calculus in the apex, no nodules are appreciated. Seminal vesicles are normal.   Laboratory Data: Lab Results  Component Value Date   WBC 6.1 07/05/2014   HGB 13.7 07/05/2014   HCT 39.5 07/05/2014   MCV 86.1 07/05/2014   PLT 193 07/05/2014    Lab Results  Component Value Date   CREATININE 1.30 07/06/2014    No results found for: PSA  No results found for: TESTOSTERONE  Lab Results  Component Value Date   HGBA1C 5.9* 07/05/2014    Urinalysis    Component Value Date/Time   COLORURINE YELLOW 07/05/2014 1749   APPEARANCEUR HAZY* 07/05/2014 1749   LABSPEC 1.020 07/05/2014 1749   PHURINE 7.5 07/05/2014 1749   GLUCOSEU NEGATIVE 07/05/2014 1749   HGBUR NEGATIVE 07/05/2014 1749   BILIRUBINUR NEGATIVE 07/05/2014 1749   KETONESUR NEGATIVE 07/05/2014 1749   PROTEINUR NEGATIVE 07/05/2014 1749   UROBILINOGEN 0.2 07/05/2014 1749   NITRITE NEGATIVE 07/05/2014 1749   LEUKOCYTESUR SMALL* 07/05/2014 1749    Pertinent Imaging:  Assessment & Plan:  1. BPH with LUTS- IPSS 6/1 (mild).  Patient will continue his finasteride.  He will  follow up in 6 months for IPSS and DRE.   PSA History:    1.4 ng/mL on 08/29/2012    1.7 ng/mL on 02/26/2013    1.6 ng/mL on 08/29/2013    1.3 ng/mL on 03/22/2014    1.2 ng/mL on 09/23/2014    No Follow-up on file.  Michiel Cowboy, PA-C  [facility] [address] [place], [place] [address] [phone]

## 2015-03-30 DIAGNOSIS — N138 Other obstructive and reflux uropathy: Secondary | ICD-10-CM | POA: Insufficient documentation

## 2015-03-30 DIAGNOSIS — N401 Enlarged prostate with lower urinary tract symptoms: Principal | ICD-10-CM

## 2015-07-22 ENCOUNTER — Other Ambulatory Visit: Payer: Self-pay

## 2015-07-22 NOTE — Patient Outreach (Signed)
[facility]) Care Management  07/22/2015  Glenn [name] 09/23/1934 [id number]   Referral from [employer] Tier 2 List, assigned Fleeta Emmer, RN to outreach for [facility] Care Management services.  Thanks, Corrie Mckusick. Sharlee Blew [facility] Care Management [facility] CM Assistant Phone: [phone] Fax: [phone]

## 2015-07-22 NOTE — Patient Outreach (Signed)
[facility]) Care Management  07/22/2015  LEEANDREW KINDSCHI 1934-09-20 [id number]   First telephone call to patient regarding Tier 2 referral.  No answer.  HIPAA compliant message left.   Plan: RN Health Coach will attempt telephone outreach within 1-2 weeks.    [name], RN, MSN [facility] Care Management RN Telephonic Health Coach [phone]

## 2015-07-28 ENCOUNTER — Other Ambulatory Visit: Payer: Self-pay

## 2015-07-28 NOTE — Patient Outreach (Signed)
[facility]) Care Management  07/28/2015  MARTAVIOUS MIERA Jan 18, 1934 [id number]   Second telephone call to patient regarding Tier 2 referral.  No answer.  HIPAA compliant message left.   Plan: RN Health Coach will attempt telephone outreach within 1-2 weeks.    [name], RN, MSN [facility] Care Management RN Telephonic Health Coach [phone]

## 2015-07-30 ENCOUNTER — Other Ambulatory Visit: Payer: Self-pay

## 2015-07-30 NOTE — Patient Outreach (Signed)
[facility]) Care Management  07/30/2015  Glenn [name] [birth year] [id number]  Third telephone call to patient regarding Tier 2 referral.  No answer.  HIPAA compliant message left.    Plan: RN will send outreach letter to attempt contact.    [name], RN, MSN [facility] Care Management RN Telephonic Health Coach [phone]

## 2015-08-15 NOTE — Patient Outreach (Signed)
[facility]) Care Management  08/15/2015  Glenn [name] 1934-09-26 [id number]   No response from patient after 3 outreach calls and letter.  Plan: RN Health Coach will forward patient information to [name] for case closure.    [name], RN, MSN [facility] Care Management RN Telephonic Health Coach [phone]

## 2015-08-21 NOTE — Patient Outreach (Signed)
[facility]) Care Management  08/21/2015  TYSHAN ZINGSHEIM 02/20/1934 [id number]   Notification from Fleeta Emmer, RN to close case due to unable to contact patient for [facility] Care Management services.  Thanks, Corrie Mckusick. Sharlee Blew [facility] Care Management [facility] CM Assistant Phone: [phone] Fax: [phone]

## 2015-09-18 ENCOUNTER — Other Ambulatory Visit: Payer: Medicare Other

## 2015-09-18 DIAGNOSIS — R972 Elevated prostate specific antigen [PSA]: Secondary | ICD-10-CM

## 2015-09-18 DIAGNOSIS — I10 Essential (primary) hypertension: Secondary | ICD-10-CM

## 2015-09-18 NOTE — Progress Notes (Signed)
Pt brought in lab orders from PCP. Tsh, lipids, cmet, and cbc were drawn today for PCP.

## 2015-09-19 LAB — CBC
Hematocrit: 40.1 % (ref 37.5–51.0)
Hemoglobin: 13.8 g/dL (ref 12.6–17.7)
MCH: 30.4 pg (ref 26.6–33.0)
MCHC: 34.4 g/dL (ref 31.5–35.7)
MCV: 88 fL (ref 79–97)
Platelets: 190 10*3/uL (ref 150–379)
RBC: 4.54 x10E6/uL (ref 4.14–5.80)
RDW: 14 % (ref 12.3–15.4)
WBC: 4.4 10*3/uL (ref 3.4–10.8)

## 2015-09-19 LAB — TSH: TSH: 6.04 u[IU]/mL — ABNORMAL HIGH (ref 0.450–4.500)

## 2015-09-19 LAB — LIPID PANEL
Chol/HDL Ratio: 7.8 ratio units — ABNORMAL HIGH (ref 0.0–5.0)
Cholesterol, Total: 256 mg/dL — ABNORMAL HIGH (ref 100–199)
HDL: 33 mg/dL — ABNORMAL LOW (ref >39)
LDL Calculated: 165 mg/dL — ABNORMAL HIGH (ref 0–99)
Triglycerides: 290 mg/dL — ABNORMAL HIGH (ref 0–149)
VLDL Cholesterol Cal: 58 mg/dL — ABNORMAL HIGH (ref 5–40)

## 2015-09-19 LAB — COMPREHENSIVE METABOLIC PANEL
ALT: 15 IU/L (ref 0–44)
AST: 19 IU/L (ref 0–40)
Albumin/Globulin Ratio: 1.4 (ref 1.1–2.5)
Albumin: 4.2 g/dL (ref 3.5–4.8)
Alkaline Phosphatase: 80 IU/L (ref 39–117)
BUN/Creatinine Ratio: 16 (ref 10–22)
BUN: 23 mg/dL (ref 8–27)
Bilirubin Total: 0.3 mg/dL (ref 0.0–1.2)
CO2: 24 mmol/L (ref 18–29)
Calcium: 10.5 mg/dL — ABNORMAL HIGH (ref 8.6–10.2)
Chloride: 99 mmol/L (ref 97–106)
Creatinine, Ser: 1.47 mg/dL — ABNORMAL HIGH (ref 0.76–1.27)
GFR calc Af Amer: 52 mL/min/{1.73_m2} — ABNORMAL LOW (ref >59)
GFR calc non Af Amer: 45 mL/min/{1.73_m2} — ABNORMAL LOW (ref >59)
Globulin, Total: 3 g/dL (ref 1.5–4.5)
Glucose: 70 mg/dL (ref 65–99)
Potassium: 5.2 mmol/L (ref 3.5–5.2)
Sodium: 137 mmol/L (ref 136–144)
Total Protein: 7.2 g/dL (ref 6.0–8.5)

## 2015-09-19 LAB — PSA: Prostate Specific Ag, Serum: 3.7 ng/mL (ref 0.0–4.0)

## 2015-09-22 ENCOUNTER — Telehealth: Payer: Self-pay

## 2015-09-22 NOTE — Telephone Encounter (Signed)
LMOM

## 2015-09-22 NOTE — Telephone Encounter (Signed)
-----   Message from Harle Battiest, PA-C sent at 09/21/2015  6:36 PM EST ----- Patient's PSA has risen since his last visit with [place] 6 months ago.   There is no need to be alarmed at this time.  We will discuss this further at his visit on the 7th.

## 2015-09-24 ENCOUNTER — Encounter: Payer: Self-pay | Admitting: Urology

## 2015-09-24 ENCOUNTER — Ambulatory Visit (INDEPENDENT_AMBULATORY_CARE_PROVIDER_SITE_OTHER): Payer: Medicare Other | Admitting: Urology

## 2015-09-24 VITALS — BP 154/73 | HR 92 | Ht 65.0 in | Wt 170.3 lb

## 2015-09-24 DIAGNOSIS — N401 Enlarged prostate with lower urinary tract symptoms: Secondary | ICD-10-CM | POA: Diagnosis not present

## 2015-09-24 DIAGNOSIS — Z87898 Personal history of other specified conditions: Secondary | ICD-10-CM

## 2015-09-24 DIAGNOSIS — N138 Other obstructive and reflux uropathy: Secondary | ICD-10-CM

## 2015-09-24 NOTE — Progress Notes (Signed)
8:44 PM   Glenn [name] 1933/11/30 [id number]  Referring provider: Dorothey Baseman, MD [address] [facility] [place], [place] [address]  Chief Complaint  Patient presents with  . Benign Prostatic Hypertrophy    6 month follow up    HPI: Patient is a 81 year old white male with a history of elevated PSA and BPH with LUTS who presents today for 6 month follow-up.  History of elevated PSA Patient had an elevated PSA of 4.90 ng/mL in 01/2008.  His PSA's have been less than 2 since the Initiation of Avodart.  He has not taken the Avodart for the last several months and has not noticed any changes in his urinary symptoms.  His PSA is now 3.7.    BPH WITH LUTS His IPSS score today is 3, which is mild lower urinary tract symptomatology. He is mostly satisfied with his quality life due to his urinary symptoms. He denies any dysuria, hematuria or suprapubic pain.  His has had TUMT in 2010.  He also denies any recent fevers, chills, nausea or vomiting.  He does not have a family history of PCa.      IPSS      09/24/15 1400       International Prostate Symptom Score   How often have you had the sensation of not emptying your bladder? Not at All     How often have you had to urinate less than every two hours? Not at All     How often have you found you stopped and started again several times when you urinated? Not at All     How often have you found it difficult to postpone urination? Not at All     How often have you had a weak urinary stream? Not at All     How often have you had to strain to start urination? Not at All     How many times did you typically get up at night to urinate? 3 Times     Total IPSS Score 3     Quality of Life due to urinary symptoms   If you were to spend the rest of your life with your urinary condition just the way it is now how would you feel about that? Mostly Satisfied        Score:  1-7 Mild 8-19  Moderate 20-35 Severe   PMH: Past Medical History  Diagnosis Date  . Hypertension   . High cholesterol   . Enlarged prostate   . Thyroid disease   . H/O aortic valve replacement     a. 12/2006 Tissue AVR @ time of CABG.  . H/O mitral valve replacement     a. 12/2006 MV repair @ time of CABG.  . Sick sinus syndrome (HCC)     a. 12/2006 s/p MDT dual chamber PPM.  . CAD (coronary artery disease)     a. 12/2006 CABG x 3: LIMA->LAD, VG->PDA, VG->OM.  Marland Kitchen Elevated PSA   . Constipation     Surgical History: Past Surgical History  Procedure Laterality Date  . Coronary artery bypass graft    . Pacemaker insertion  2008  . Aov  replacement    . Medial partial knee replacement  2011  . Turp vaporization  2010  . Intraocular lens insertion  2007    Home Medications:    Medication List       This list is accurate as of:  09/24/15 11:59 PM.  Always use your most recent med list.               aspirin EC 325 MG tablet  Take 325 mg by mouth at bedtime.     cetirizine 10 MG tablet  Commonly known as:  ZYRTEC  Take by mouth.     ferrous sulfate 325 (65 FE) MG tablet  Take 325 mg by mouth daily with breakfast.     FIBER CHOICE PO  Take 2 capsules by mouth daily.     fluticasone 50 MCG/ACT nasal spray  Commonly known as:  FLONASE  Place 2 sprays into both nostrils daily as needed for allergies.     levothyroxine 88 MCG tablet  Commonly known as:  SYNTHROID, LEVOTHROID  Take 88 mcg by mouth daily.     levothyroxine 75 MCG tablet  Commonly known as:  SYNTHROID, LEVOTHROID     lisinopril 5 MG tablet  Commonly known as:  PRINIVIL,ZESTRIL     lovastatin 40 MG tablet  Commonly known as:  MEVACOR  Take by mouth.     metoprolol succinate 50 MG 24 hr tablet  Commonly known as:  TOPROL-XL  Take 50 mg by mouth daily.     MULTI-VITAMINS Tabs  Take by mouth.     tiotropium 18 MCG inhalation capsule  Commonly known as:  SPIRIVA  Place into inhaler and inhale.         Allergies: No Known Allergies  Family History: Family History  Problem Relation Age of Onset  . Heart attack Father   . Hypertension Father   . Stroke Mother   . Cancer Neg Hx     Kidney, Bladder, Prostate    Social History:  reports that he quit smoking about 48 years ago. His smoking use included Cigarettes. He has a 15 pack-year smoking history. He does not have any smokeless tobacco history on file. He reports that he does not drink alcohol or use illicit drugs.  ROS: Urological Symptom Review  Patient is experiencing the following symptoms: Frequent urination Get up at night to urinate Weak stream   Review of Systems  Gastrointestinal (upper)  : Negative for upper GI symptoms  Gastrointestinal (lower) : Negative for lower GI symptoms  Constitutional : Negative for symptoms  Skin: Negative for skin symptoms  Eyes: Negative for eye symptoms  Ear/Nose/Throat : Negative for Ear/Nose/Throat symptoms  Hematologic/Lymphatic: Negative for Hematologic/Lymphatic symptoms  Cardiovascular : Negative for cardiovascular symptoms  Respiratory : Negative for respiratory symptoms  Endocrine: Negative for endocrine symptoms  Musculoskeletal: Negative for musculoskeletal symptoms  Neurological: Negative for neurological symptoms  Psychologic: Negative for psychiatric symptoms   Physical Exam: BP 154/73 mmHg  Pulse 92  Ht  (1.651 m)  Wt 170 lb 4.8 oz (77.248 kg)  BMI 28.34 kg/m2  GU: Patient with an uncircumcised phallus. Foreskin easily retracted. Some mild adhesions to the coronal surface.  Urethral meatus is patent.  No penile discharge. No penile lesions or rashes. Scrotum without lesions, cysts, rashes and/or edema.  Testicles are located scrotally bilaterally. No masses are appreciated in the testicles. Left and right epididymis are normal. Left varicocele is noted. Rectal: Patient with  normal sphincter tone. Perineum without scarring or  rashes. No rectal masses are appreciated. Prostate is approximately 55 grams, small calculus in the apex, no nodules are appreciated. Seminal vesicles are normal.   Laboratory Data: Lab Results  Component Value Date   WBC 4.4 09/18/2015   HGB 13.7  07/05/2014   HCT 40.1 09/18/2015   MCV 86.1 07/05/2014   PLT 193 07/05/2014    Lab Results  Component Value Date   CREATININE 1.47* 09/18/2015  PSA History:    1.4 ng/mL on 08/29/2012    1.7 ng/mL on 02/26/2013    1.6 ng/mL on 08/29/2013    1.3 ng/mL on 03/22/2014    1.2 ng/mL on 09/23/2014   Lab Results  Component Value Date   PSA 3.7 09/18/2015    Lab Results  Component Value Date   HGBA1C 5.9* 07/05/2014    Assessment & Plan:   1. BPH with LUTS-    Patient's I PSS scores 3/2.  He is no longer taking finasteride or Avodart.  We'll continue to monitor. He'll return in 6 months time, per patient's request, for I PSS score, PSA and exam.  .    2. History of elevated PSA:   Patient has a history of an elevated PSA of 4.9 and 2009.   His PSAs had reduced to below to the addition of Avodart and then finasteride. He has since stopped both medication and his PSA has risen to 3.7.   He would like to return in 6 months time for continued monitoring. I did explain to the patient that per AUA guidelines, men his age should not undergo a prostate biopsy unless the PSA reaches 10 or greater. He is with agreement.   Return in about 6 months (around 03/24/2016) for IPSS score and exam.  Michiel Cowboy, [facility] [address], [address] [place], [place] [address] [phone]

## 2015-09-24 NOTE — Telephone Encounter (Signed)
LMOM

## 2015-09-24 NOTE — Telephone Encounter (Signed)
Pt daughter returned call. Made aware of PSA results and visit today. Daughter voiced understanding.

## 2015-09-28 DIAGNOSIS — Z87898 Personal history of other specified conditions: Secondary | ICD-10-CM | POA: Insufficient documentation

## 2016-05-20 ENCOUNTER — Other Ambulatory Visit: Payer: Self-pay | Admitting: Specialist

## 2016-05-20 DIAGNOSIS — J90 Pleural effusion, not elsewhere classified: Secondary | ICD-10-CM

## 2016-06-22 ENCOUNTER — Ambulatory Visit: Admission: RE | Admit: 2016-06-22 | Payer: Medicare Other | Source: Ambulatory Visit

## 2016-07-08 ENCOUNTER — Ambulatory Visit
Admission: RE | Admit: 2016-07-08 | Discharge: 2016-07-08 | Disposition: A | Discharge status: Home / Self Care | Payer: Medicare Other | Source: Ambulatory Visit | Attending: Specialist | Admitting: Specialist

## 2016-07-08 DIAGNOSIS — I2510 Atherosclerotic heart disease of native coronary artery without angina pectoris: Secondary | ICD-10-CM | POA: Diagnosis not present

## 2016-07-08 DIAGNOSIS — J90 Pleural effusion, not elsewhere classified: Secondary | ICD-10-CM | POA: Diagnosis present

## 2017-02-21 NOTE — Progress Notes (Signed)
3:36 PM   Glenn [name] February 11, 1934 [id number]  Referring provider: Dorothey Baseman, MD [phone] [address] [place], [place] [address]  Chief Complaint  Patient presents with  . Elevated PSA    PCP states patient psa high again last seen 12/16    HPI: Patient is a 83 year old Caucasian male with a history of elevated PSA and BPH with LUTS who presents today as a referral from his PCP for a high PSA.    History of elevated PSA Patient had an elevated PSA of 4.90 ng/mL in 01/2008.  His PSA's have been less than 2 since the Initiation of Avodart.  He has not taken the Avodart for the last several months and has not noticed any changes in his urinary symptoms.  His PSA is now 5.18.  BPH WITH LUTS His IPSS score today is 17, which is moderate lower urinary tract symptomatology. He is pleased with his quality life due to his urinary symptoms.  His previous I PSS score was 3/2.  He denies any dysuria, hematuria or suprapubic pain.  His has had TUMT in 2010.  He also denies any recent fevers, chills, nausea or vomiting.  He does not have a family history of PCa.      IPSS    Row Name 02/22/17 1500         International Prostate Symptom Score   How often have you had the sensation of not emptying your bladder? Almost always     How often have you had to urinate less than every two hours? About half the time     How often have you found you stopped and started again several times when you urinated? Less than half the time     How often have you found it difficult to postpone urination? Not at All     How often have you had a weak urinary stream? About half the time     How often have you had to strain to start urination? Less than 1 in 5 times     How many times did you typically get up at night to urinate? 3 Times     Total IPSS Score 17       Quality of Life due to urinary symptoms   If you were to spend the rest of your life with your urinary condition just the way it is now how  would you feel about that? Pleased        Score:  1-7 Mild 8-19 Moderate 20-35 Severe   PMH: Past Medical History:  Diagnosis Date  . CAD (coronary artery disease)    a. 12/2006 CABG x 3: LIMA->LAD, VG->PDA, VG->OM.  Marland Kitchen Constipation   . Elevated PSA   . Enlarged prostate   . H/O aortic valve replacement    a. 12/2006 Tissue AVR @ time of CABG.  . H/O mitral valve replacement    a. 12/2006 MV repair @ time of CABG.  . High cholesterol   . Hypertension   . Sick sinus syndrome (HCC)    a. 12/2006 s/p MDT dual chamber PPM.  . Thyroid disease     Surgical History: Past Surgical History:  Procedure Laterality Date  . AoV  replacement    . CORONARY ARTERY BYPASS GRAFT    . INTRAOCULAR LENS INSERTION  2007  . MEDIAL PARTIAL KNEE REPLACEMENT  2011  . PACEMAKER INSERTION  2008  . TURP VAPORIZATION  2010    Home Medications:  Allergies  as of 02/22/2017   No Known Allergies     Medication List       Accurate as of 02/22/17  3:36 PM. Always use your most recent med list.          aspirin EC 325 MG tablet Take 325 mg by mouth at bedtime.   azelastine 0.1 % nasal spray Commonly known as:  ASTELIN Place into the nose.   cetirizine 10 MG tablet Commonly known as:  ZYRTEC Take by mouth.   cetirizine 10 MG tablet Commonly known as:  ZYRTEC TAKE 1 TABLET BY MOUTH ONCE DAILY.   ferrous sulfate 325 (65 FE) MG tablet Take 325 mg by mouth daily with breakfast.   FIBER CHOICE PO Take 2 capsules by mouth daily.   fluticasone 50 MCG/ACT nasal spray Commonly known as:  FLONASE Place 2 sprays into both nostrils daily as needed for allergies.   levothyroxine 88 MCG tablet Commonly known as:  SYNTHROID, LEVOTHROID Take 88 mcg by mouth daily.   levothyroxine 75 MCG tablet Commonly known as:  SYNTHROID, LEVOTHROID   lisinopril 5 MG tablet Commonly known as:  PRINIVIL,ZESTRIL   lovastatin 40 MG tablet Commonly known as:  MEVACOR Take by mouth.   metoprolol succinate  50 MG 24 hr tablet Commonly known as:  TOPROL-XL Take 50 mg by mouth daily.   MULTI-VITAMINS Tabs Take by mouth.   tiotropium 18 MCG inhalation capsule Commonly known as:  SPIRIVA Place into inhaler and inhale.   SPIRIVA HANDIHALER 18 MCG inhalation capsule Generic drug:  tiotropium INHALE 1 CAPSULE AS DIRECTED ONCE DAILY.       Allergies: No Known Allergies  Family History: Family History  Problem Relation Age of Onset  . Heart attack Father   . Hypertension Father   . Stroke Mother   . Cancer Neg Hx     Kidney, Bladder, Prostate    Social History:  reports that he quit smoking about 49 years ago. His smoking use included Cigarettes. He has a 15.00 pack-year smoking history. He has never used smokeless tobacco. He reports that he does not drink alcohol or use drugs.  ROS: UROLOGY Frequent Urination?: No Hard to postpone urination?: No Burning/pain with urination?: No Get up at night to urinate?: No Leakage of urine?: No Urine stream starts and stops?: No Trouble starting stream?: No Do you have to strain to urinate?: No Blood in urine?: No Urinary tract infection?: No Sexually transmitted disease?: No Injury to kidneys or bladder?: No Painful intercourse?: No Weak stream?: No Erection problems?: No Penile pain?: No Gastrointestinal Nausea?: No Vomiting?: No Indigestion/heartburn?: No Diarrhea?: No Constipation?: No Constitutional Fever: No Night sweats?: No Weight loss?: No Fatigue?: No Skin Skin rash/lesions?: No Itching?: No Eyes Blurred vision?: No Double vision?: No Ears/Nose/Throat Sore throat?: No Sinus problems?: No Hematologic/Lymphatic Swollen glands?: No Easy bruising?: No Cardiovascular Leg swelling?: No Chest pain?: No Respiratory Cough?: No Shortness of breath?: No Endocrine Excessive thirst?: No Musculoskeletal Back pain?: No Joint pain?: No Neurological Headaches?: No Dizziness?: No Psychologic Depression?:  No Anxiety?: No   Physical Exam: BP 102/63   Pulse 65   Ht 5\' 8"  (1.727 m)   Wt 165 lb 9.6 oz (75.1 kg)   BMI 25.18 kg/m   GU: Patient with an uncircumcised phallus. Foreskin easily retracted. Some mild adhesions to the coronal surface.  Urethral meatus is patent.  No penile discharge. No penile lesions or rashes. Scrotum without lesions, cysts, rashes and/or edema.  Testicles are located scrotally  bilaterally. No masses are appreciated in the testicles. Left and right epididymis are normal. Left varicocele is noted. Rectal: Patient with  normal sphincter tone. Perineum without scarring or rashes. No rectal masses are appreciated. Prostate is approximately 55 grams, small calculus in the apex, no nodules are appreciated. Seminal vesicles are normal.   Laboratory Data: Lab Results  Component Value Date   WBC 4.4 09/18/2015   HGB 13.7 07/05/2014   HCT 40.1 09/18/2015   MCV 88 09/18/2015   PLT 190 09/18/2015    Lab Results  Component Value Date   CREATININE 1.47 (H) 09/18/2015  PSA History:    1.4 ng/mL on 08/29/2012    1.7 ng/mL on 02/26/2013    1.6 ng/mL on 08/29/2013    1.3 ng/mL on 03/22/2014    1.2 ng/mL on 09/23/2014     3.7 ng/mL on 09/18/2015     5.18 ng/mL on 02/2017    Lab Results  Component Value Date   HGBA1C 5.9 (H) 07/05/2014    Assessment & Plan:   1. BPH with LUTS-    Patient's I PSS scores 17/1.  He is no longer taking finasteride or Avodart.  RTC prn.  2. History of elevated PSA:   Patient has a history of an elevated PSA of 4.9 and 2009.   PSA is now 5.19 per report.  Not worrisome value in an 83 year old gentleman.  Given low velocity over the last two years and co -morbidities, I do not recommend further screening.     No Follow-up on file.  Michiel Cowboy, PA-C  [facility] [address], [address] [place], [place] [address] [phone]

## 2017-02-22 ENCOUNTER — Other Ambulatory Visit: Payer: Self-pay

## 2017-02-22 ENCOUNTER — Encounter: Payer: Self-pay | Admitting: Urology

## 2017-02-22 ENCOUNTER — Ambulatory Visit (INDEPENDENT_AMBULATORY_CARE_PROVIDER_SITE_OTHER): Payer: Medicare Other | Admitting: Urology

## 2017-02-22 VITALS — BP 102/63 | HR 65 | Ht 68.0 in | Wt 165.6 lb

## 2017-02-22 DIAGNOSIS — R972 Elevated prostate specific antigen [PSA]: Secondary | ICD-10-CM

## 2017-02-22 DIAGNOSIS — N138 Other obstructive and reflux uropathy: Secondary | ICD-10-CM | POA: Diagnosis not present

## 2017-02-22 DIAGNOSIS — N401 Enlarged prostate with lower urinary tract symptoms: Secondary | ICD-10-CM | POA: Diagnosis not present

## 2017-05-25 ENCOUNTER — Emergency Department: Payer: Medicare Other

## 2017-05-25 ENCOUNTER — Encounter: Payer: Self-pay | Admitting: Emergency Medicine

## 2017-05-25 DIAGNOSIS — I501 Left ventricular failure: Secondary | ICD-10-CM | POA: Insufficient documentation

## 2017-05-25 DIAGNOSIS — I2510 Atherosclerotic heart disease of native coronary artery without angina pectoris: Secondary | ICD-10-CM | POA: Diagnosis not present

## 2017-05-25 DIAGNOSIS — Z951 Presence of aortocoronary bypass graft: Secondary | ICD-10-CM | POA: Insufficient documentation

## 2017-05-25 DIAGNOSIS — N183 Chronic kidney disease, stage 3 (moderate): Secondary | ICD-10-CM | POA: Insufficient documentation

## 2017-05-25 DIAGNOSIS — Z952 Presence of prosthetic heart valve: Secondary | ICD-10-CM | POA: Insufficient documentation

## 2017-05-25 DIAGNOSIS — Z7982 Long term (current) use of aspirin: Secondary | ICD-10-CM | POA: Insufficient documentation

## 2017-05-25 DIAGNOSIS — R0600 Dyspnea, unspecified: Secondary | ICD-10-CM | POA: Diagnosis not present

## 2017-05-25 DIAGNOSIS — I129 Hypertensive chronic kidney disease with stage 1 through stage 4 chronic kidney disease, or unspecified chronic kidney disease: Secondary | ICD-10-CM | POA: Insufficient documentation

## 2017-05-25 DIAGNOSIS — Z950 Presence of cardiac pacemaker: Secondary | ICD-10-CM | POA: Diagnosis not present

## 2017-05-25 DIAGNOSIS — R0602 Shortness of breath: Secondary | ICD-10-CM | POA: Diagnosis present

## 2017-05-25 DIAGNOSIS — J449 Chronic obstructive pulmonary disease, unspecified: Secondary | ICD-10-CM | POA: Diagnosis not present

## 2017-05-25 DIAGNOSIS — Z79899 Other long term (current) drug therapy: Secondary | ICD-10-CM | POA: Insufficient documentation

## 2017-05-25 DIAGNOSIS — Z87891 Personal history of nicotine dependence: Secondary | ICD-10-CM | POA: Diagnosis not present

## 2017-05-25 DIAGNOSIS — J90 Pleural effusion, not elsewhere classified: Secondary | ICD-10-CM | POA: Insufficient documentation

## 2017-05-25 LAB — CBC
HCT: 38.6 % — ABNORMAL LOW (ref 40.0–52.0)
Hemoglobin: 13.2 g/dL (ref 13.0–18.0)
MCH: 30.8 pg (ref 26.0–34.0)
MCHC: 34.2 g/dL (ref 32.0–36.0)
MCV: 90.2 fL (ref 80.0–100.0)
Platelets: 196 10*3/uL (ref 150–440)
RBC: 4.28 MIL/uL — ABNORMAL LOW (ref 4.40–5.90)
RDW: 13.1 % (ref 11.5–14.5)
WBC: 5.7 10*3/uL (ref 3.8–10.6)

## 2017-05-25 LAB — BASIC METABOLIC PANEL
Anion gap: 7 (ref 5–15)
BUN: 24 mg/dL — ABNORMAL HIGH (ref 6–20)
CO2: 25 mmol/L (ref 22–32)
Calcium: 10.8 mg/dL — ABNORMAL HIGH (ref 8.9–10.3)
Chloride: 98 mmol/L — ABNORMAL LOW (ref 101–111)
Creatinine, Ser: 1.68 mg/dL — ABNORMAL HIGH (ref 0.61–1.24)
GFR calc Af Amer: 43 mL/min — ABNORMAL LOW (ref >60)
GFR calc non Af Amer: 37 mL/min — ABNORMAL LOW (ref >60)
Glucose, Bld: 97 mg/dL (ref 65–99)
Potassium: 4.3 mmol/L (ref 3.5–5.1)
Sodium: 130 mmol/L — ABNORMAL LOW (ref 135–145)

## 2017-05-25 LAB — TROPONIN I: Troponin I: 0.03 ng/mL (ref <0.03)

## 2017-05-25 NOTE — ED Notes (Signed)
Charge nurse notified of pt's troponin 

## 2017-05-25 NOTE — ED Triage Notes (Signed)
Pt to triage in wheelchair. Pt accompanied by daughter who pt lives with and is taken care by. Per pt her has had increased SOB today, more than he usually does on a daily basis due to COPD. Pt denies chest pain and is not on O2 at home. O2 level in triage @ RA is 100%.

## 2017-05-26 ENCOUNTER — Other Ambulatory Visit: Payer: Self-pay

## 2017-05-26 ENCOUNTER — Emergency Department
Admission: EM | Admit: 2017-05-26 | Discharge: 2017-05-26 | Disposition: A | Discharge status: Home / Self Care | Payer: Medicare Other | Attending: Emergency Medicine | Admitting: Emergency Medicine

## 2017-05-26 DIAGNOSIS — R0600 Dyspnea, unspecified: Secondary | ICD-10-CM | POA: Diagnosis not present

## 2017-05-26 DIAGNOSIS — J90 Pleural effusion, not elsewhere classified: Secondary | ICD-10-CM

## 2017-05-26 LAB — TROPONIN I: Troponin I: 0.03 ng/mL (ref <0.03)

## 2017-05-26 LAB — FIBRIN DERIVATIVES D-DIMER (ARMC ONLY): Fibrin derivatives D-dimer (ARMC): 1735.86 — ABNORMAL HIGH (ref 0.00–499.00)

## 2017-05-26 NOTE — ED Provider Notes (Signed)
[facility] Emergency Department Provider Note   First MD Initiated Contact with Patient 05/26/17 0100     (approximate)  I have reviewed the triage vital signs and the nursing notes.   HISTORY  Chief Complaint Shortness of Breath    HPI Glenn [name] is a 83 y.o. male with below list of chronic medical conditions presents to the emergency department accompanied by his daughter with complaint of increased dyspnea today. Patient and daughter states that he has had dyspnea for a long time" however as noted episodic worsening today. Patient denies any dyspnea at present no chest pain no palpitations or diaphoresis. Patient states that "it comes and goes" patient denies any aggravating or alleviating factors   Past Medical History:  Diagnosis Date  . CAD (coronary artery disease)    a. 12/2006 CABG x 3: LIMA->LAD, VG->PDA, VG->OM.  Marland Kitchen Constipation   . Elevated PSA   . Enlarged prostate   . H/O aortic valve replacement    a. 12/2006 Tissue AVR @ time of CABG.  . H/O mitral valve replacement    a. 12/2006 MV repair @ time of CABG.  . High cholesterol   . Hypertension   . Sick sinus syndrome (HCC)    a. 12/2006 s/p MDT dual chamber PPM.  . Thyroid disease     Patient Active Problem List   Diagnosis Date Noted  . History of elevated PSA 09/28/2015  . BPH with obstruction/lower urinary tract symptoms 03/30/2015  . CAFL (chronic airflow limitation) (HCC) 03/25/2015  . Aortic valve defect 07/05/2014  . Asthma with chronic obstructive pulmonary disease (COPD) (HCC) 07/05/2014  . CHF with unknown LVEF (HCC) 07/05/2014  . CAD (coronary artery disease) 07/05/2014  . HLD (hyperlipidemia) 07/05/2014  . Hypertensive urgency 07/05/2014  . Anemia, iron deficiency 07/05/2014  . Disorder of mitral valve 07/05/2014  . Arthritis, degenerative 07/05/2014  . Sick sinus syndrome (HCC) 07/05/2014  . CKD (chronic kidney disease) stage 3, GFR 30-59 ml/min 07/05/2014  .  Chronic kidney disease (CKD), stage III (moderate) 07/05/2014  . CCF (congestive cardiac failure) (HCC) 07/05/2014  . Atherosclerosis of coronary artery 07/05/2014  . Essential (primary) hypertension 07/05/2014  . BPH (benign prostatic hypertrophy) 05/03/2014  . Benign prostatic hypertrophy without urinary obstruction 05/03/2014    Past Surgical History:  Procedure Laterality Date  . AoV  replacement    . CORONARY ARTERY BYPASS GRAFT    . INTRAOCULAR LENS INSERTION  2007  . MEDIAL PARTIAL KNEE REPLACEMENT  2011  . PACEMAKER INSERTION  2008  . TURP VAPORIZATION  2010    Prior to Admission medications   Medication Sig Start Date End Date Taking? Authorizing Provider  aspirin EC 325 MG tablet Take 325 mg by mouth at bedtime.    Yes [provider]  azelastine (ASTELIN) 0.1 % nasal spray Place into the nose. 05/20/16 05/26/17 Yes [provider]  cetirizine (ZYRTEC) 10 MG tablet Take by mouth. 09/22/15 05/26/17 Yes [provider]  cetirizine (ZYRTEC) 10 MG tablet TAKE 1 TABLET BY MOUTH ONCE DAILY. 06/11/16  Yes [provider]  ferrous sulfate 325 (65 FE) MG tablet Take 325 mg by mouth daily with breakfast.   Yes [provider]  fluticasone (FLONASE) 50 MCG/ACT nasal spray Place 2 sprays into both nostrils daily as needed for allergies.  05/10/14  Yes [provider]  Inulin (FIBER CHOICE PO) Take 2 capsules by mouth daily.   Yes [provider]  levothyroxine (SYNTHROID, LEVOTHROID)  75 MCG tablet  12/31/14  Yes [provider]  levothyroxine (SYNTHROID, LEVOTHROID) 88 MCG tablet Take 88 mcg by mouth daily.  07/02/14  Yes [provider]  lisinopril (PRINIVIL,ZESTRIL) 5 MG tablet  03/03/15  Yes [provider]  lovastatin (MEVACOR) 40 MG tablet Take by mouth. 09/22/15  Yes [provider]  metoprolol succinate (TOPROL-XL) 50 MG 24 hr tablet Take 50 mg by mouth daily.  06/18/14  Yes [provider]  Multiple Vitamin (MULTI-VITAMINS) TABS Take by mouth.   Yes [provider]  tiotropium (SPIRIVA HANDIHALER) 18 MCG inhalation capsule INHALE 1 CAPSULE AS DIRECTED ONCE DAILY. 08/10/16  Yes [provider]  tiotropium (SPIRIVA) 18 MCG inhalation capsule Place into inhaler and inhale. 09/24/15 05/26/17 Yes [provider]    Allergies No known drug allergies  Family History  Problem Relation Age of Onset  . Heart attack Father   . Hypertension Father   . Stroke Mother   . Cancer Neg Hx        Kidney, Bladder, Prostate    Social History Social History  Substance Use Topics  . Smoking status: Former Smoker    Packs/day: 1.00    Years: 15.00    Types: Cigarettes    Quit date: 07/06/1967  . Smokeless tobacco: Never Used     Comment: Quit over 38 years ago  . Alcohol use No     Comment: Occasional use in the past.    Review of Systems Constitutional: No fever/chills Eyes: No visual changes. ENT: No sore throat. Cardiovascular: Denies chest pain. Respiratory: Positive for shortness of breath. Gastrointestinal: No abdominal pain.  No nausea, no vomiting.  No diarrhea.  No constipation. Genitourinary: Negative for dysuria. Musculoskeletal: Negative for neck pain.  Negative for back pain. Integumentary: Negative for rash. Neurological: Negative for headaches, focal weakness or numbness.   ____________________________________________   PHYSICAL EXAM:  VITAL SIGNS: ED Triage Vitals  Enc Vitals Group     BP 05/25/17 2314 (!) 170/100     Pulse Rate 05/25/17 2314 66     Resp 05/25/17 2314 18     Temp 05/25/17 2314 98 F (36.7 C)     Temp Source 05/25/17 2314 Oral     SpO2 05/25/17 2314 97 %     Weight 05/25/17 2314 74.8 kg (165 lb)     Height --      Head Circumference --      Peak Flow --      Pain Score 05/26/17 0119 0     Pain Loc --      Pain Edu? --      Excl. in GC? --     Constitutional: Alert and oriented. Well appearing and in  no acute distress. Eyes: Conjunctivae are normal. Head: Atraumatic. Mouth/Throat: Mucous membranes are moist. Oropharynx non-erythematous. Neck: No stridor. Cardiovascular: Normal rate, regular rhythm. Good peripheral circulation. Grossly normal heart sounds. Respiratory: Normal respiratory effort.  No retractions. Lungs CTAB. Gastrointestinal: Soft and nontender. No distention.  Musculoskeletal: No lower extremity tenderness nor edema. No gross deformities of extremities. Neurologic:  Normal speech and language. No gross focal neurologic deficits are appreciated.  Skin:  Skin is warm, dry and intact. No rash noted. Psychiatric: Mood and affect are normal. Speech and behavior are normal.  ____________________________________________   LABS (all labs ordered are listed, but only abnormal results are displayed)  Labs Reviewed  BASIC METABOLIC PANEL - Abnormal; Notable for the following:  Result Value   Sodium 130 (*)    Chloride 98 (*)    BUN 24 (*)    Creatinine, Ser 1.68 (*)    Calcium 10.8 (*)    GFR calc non Af Amer 37 (*)    GFR calc Af Amer 43 (*)    All other components within normal limits  CBC - Abnormal; Notable for the following:    RBC 4.28 (*)    HCT 38.6 (*)    All other components within normal limits  TROPONIN I - Abnormal; Notable for the following:    Troponin I 0.03 (*)    All other components within normal limits  TROPONIN I - Abnormal; Notable for the following:    Troponin I 0.03 (*)    All other components within normal limits  FIBRIN DERIVATIVES D-DIMER ([facility] ONLY) - Abnormal; Notable for the following:    Fibrin derivatives D-dimer [facility]) 1,610.96 (*)    All other components within normal limits   ____________________________________________  EKG  ED ECG REPORT I, Davie N Darneshia Demary, the attending physician, personally viewed and interpreted this ECG.   Date: 05/26/2017  EKG Time: 1:27 AM  Rate: 65  Rhythm: Ventricular paced rhythm   Axis: Normal  Intervals: Normal  ST&T Change: None  ____________________________________________  RADIOLOGY I, [place] N Abbigaile Rockman, personally viewed and evaluated these images (plain radiographs) as part of my medical decision making, as well as reviewing the written report by the radiologist.  Dg Chest 2 View  Result Date: 05/25/2017 CLINICAL DATA:  Increased shortness of breath. EXAM: CHEST  2 VIEW COMPARISON:  CT of the chest 07/08/2016 and 05/04/2013 FINDINGS: The heart size and mediastinal contours are within normal limits. Dual-chamber pacemaker present as well as prosthetic mitral and aortic valves. There is a chronic partially loculated left lateral pleural effusion which has been stable by prior CT studies. No overt pulmonary edema or focal airspace disease. No nodule or pneumothorax identified. Bones are osteopenic with degenerative disease present in the spine and both shoulders. IMPRESSION: Chronic loculated left lateral pleural effusion which has been present since at least 2014. Electronically Signed   By: Irish Lack M.D.   On: 05/25/2017 23:45       Procedures   ____________________________________________   INITIAL IMPRESSION / ASSESSMENT AND PLAN / ED COURSE  Pertinent labs & imaging results that were available during my care of the patient were reviewed by me and considered in my medical decision making (see chart for details).  83 year old male presenting the emergency department with intermittent dyspnea "for a while" acute worsening today. Patient has no dyspnea present. Laboratory data unremarkable EKG revealed no evidence of ischemia or infarction. Chest x-ray revealed left pleural effusion which is persistent since 2014. Recommended outpatient follow-up with primary care provider today is no acute etiology for the patient's dyspnea identified. I discussed the patient's daughter at length the fact that the patient's d-dimer was elevated in the setting of a  creatinine of 1.68 and a known left pleural effusion concern for possible wrist to the patient's kidney if given IV contrast bolus. Patient's daughter agreed not to have a CT scan performed at this time      ____________________________________________  FINAL CLINICAL IMPRESSION(S) / ED DIAGNOSES  Final diagnoses:  Dyspnea, unspecified type  Pleural effusion     MEDICATIONS GIVEN DURING THIS VISIT:  Medications - No data to display   NEW OUTPATIENT MEDICATIONS STARTED DURING THIS VISIT:  New Prescriptions   No medications on  file    Modified Medications   No medications on file    Discontinued Medications   No medications on file     Note:  This document was prepared using Dragon voice recognition software and may include unintentional dictation errors.    Darci Current, MD 05/26/17 [phone]

## 2017-12-04 ENCOUNTER — Inpatient Hospital Stay
Admission: EM | Admit: 2017-12-04 | Discharge: 2017-12-06 | DRG: 259 | Disposition: A | Discharge status: Home / Self Care | Payer: Medicare Other | Attending: Internal Medicine | Admitting: Internal Medicine

## 2017-12-04 ENCOUNTER — Emergency Department: Payer: Medicare Other

## 2017-12-04 ENCOUNTER — Other Ambulatory Visit: Payer: Self-pay

## 2017-12-04 ENCOUNTER — Encounter: Payer: Self-pay | Admitting: *Deleted

## 2017-12-04 DIAGNOSIS — Z8249 Family history of ischemic heart disease and other diseases of the circulatory system: Secondary | ICD-10-CM | POA: Diagnosis not present

## 2017-12-04 DIAGNOSIS — Z951 Presence of aortocoronary bypass graft: Secondary | ICD-10-CM | POA: Diagnosis not present

## 2017-12-04 DIAGNOSIS — E782 Mixed hyperlipidemia: Secondary | ICD-10-CM | POA: Diagnosis present

## 2017-12-04 DIAGNOSIS — Z950 Presence of cardiac pacemaker: Secondary | ICD-10-CM | POA: Diagnosis not present

## 2017-12-04 DIAGNOSIS — R001 Bradycardia, unspecified: Secondary | ICD-10-CM | POA: Diagnosis present

## 2017-12-04 DIAGNOSIS — E871 Hypo-osmolality and hyponatremia: Secondary | ICD-10-CM | POA: Diagnosis present

## 2017-12-04 DIAGNOSIS — Z87891 Personal history of nicotine dependence: Secondary | ICD-10-CM | POA: Diagnosis not present

## 2017-12-04 DIAGNOSIS — Z952 Presence of prosthetic heart valve: Secondary | ICD-10-CM | POA: Diagnosis not present

## 2017-12-04 DIAGNOSIS — Y712 Prosthetic and other implants, materials and accessory cardiovascular devices associated with adverse incidents: Secondary | ICD-10-CM | POA: Diagnosis present

## 2017-12-04 DIAGNOSIS — E860 Dehydration: Secondary | ICD-10-CM | POA: Diagnosis present

## 2017-12-04 DIAGNOSIS — Z823 Family history of stroke: Secondary | ICD-10-CM

## 2017-12-04 DIAGNOSIS — T82111A Breakdown (mechanical) of cardiac pulse generator (battery), initial encounter: Secondary | ICD-10-CM | POA: Diagnosis present

## 2017-12-04 DIAGNOSIS — J449 Chronic obstructive pulmonary disease, unspecified: Secondary | ICD-10-CM | POA: Diagnosis present

## 2017-12-04 DIAGNOSIS — E039 Hypothyroidism, unspecified: Secondary | ICD-10-CM | POA: Diagnosis present

## 2017-12-04 DIAGNOSIS — N182 Chronic kidney disease, stage 2 (mild): Secondary | ICD-10-CM | POA: Diagnosis present

## 2017-12-04 DIAGNOSIS — Z79890 Hormone replacement therapy (postmenopausal): Secondary | ICD-10-CM

## 2017-12-04 DIAGNOSIS — I129 Hypertensive chronic kidney disease with stage 1 through stage 4 chronic kidney disease, or unspecified chronic kidney disease: Secondary | ICD-10-CM | POA: Diagnosis present

## 2017-12-04 DIAGNOSIS — I959 Hypotension, unspecified: Secondary | ICD-10-CM | POA: Diagnosis present

## 2017-12-04 DIAGNOSIS — Z7982 Long term (current) use of aspirin: Secondary | ICD-10-CM

## 2017-12-04 DIAGNOSIS — I2510 Atherosclerotic heart disease of native coronary artery without angina pectoris: Secondary | ICD-10-CM | POA: Diagnosis present

## 2017-12-04 DIAGNOSIS — J90 Pleural effusion, not elsewhere classified: Secondary | ICD-10-CM | POA: Diagnosis not present

## 2017-12-04 DIAGNOSIS — I442 Atrioventricular block, complete: Secondary | ICD-10-CM | POA: Diagnosis present

## 2017-12-04 DIAGNOSIS — R9431 Abnormal electrocardiogram [ECG] [EKG]: Secondary | ICD-10-CM | POA: Diagnosis not present

## 2017-12-04 DIAGNOSIS — Z96659 Presence of unspecified artificial knee joint: Secondary | ICD-10-CM | POA: Diagnosis present

## 2017-12-04 HISTORY — DX: Pleural effusion, not elsewhere classified: J90

## 2017-12-04 LAB — COMPREHENSIVE METABOLIC PANEL
ALT: 27 U/L (ref 17–63)
AST: 44 U/L — ABNORMAL HIGH (ref 15–41)
Albumin: 4.4 g/dL (ref 3.5–5.0)
Alkaline Phosphatase: 76 U/L (ref 38–126)
Anion gap: 9 (ref 5–15)
BUN: 20 mg/dL (ref 6–20)
CO2: 23 mmol/L (ref 22–32)
Calcium: 10.4 mg/dL — ABNORMAL HIGH (ref 8.9–10.3)
Chloride: 94 mmol/L — ABNORMAL LOW (ref 101–111)
Creatinine, Ser: 1.47 mg/dL — ABNORMAL HIGH (ref 0.61–1.24)
GFR calc Af Amer: 50 mL/min — ABNORMAL LOW (ref >60)
GFR calc non Af Amer: 43 mL/min — ABNORMAL LOW (ref >60)
Glucose, Bld: 100 mg/dL — ABNORMAL HIGH (ref 65–99)
Potassium: 5.1 mmol/L (ref 3.5–5.1)
Sodium: 126 mmol/L — ABNORMAL LOW (ref 135–145)
Total Bilirubin: 0.7 mg/dL (ref 0.3–1.2)
Total Protein: 8 g/dL (ref 6.5–8.1)

## 2017-12-04 LAB — CBC WITH DIFFERENTIAL/PLATELET
Basophils Absolute: 0 10*3/uL (ref 0–0.1)
Basophils Relative: 1 %
Eosinophils Absolute: 0 10*3/uL (ref 0–0.7)
Eosinophils Relative: 0 %
HCT: 37.7 % — ABNORMAL LOW (ref 40.0–52.0)
Hemoglobin: 12.9 g/dL — ABNORMAL LOW (ref 13.0–18.0)
Lymphocytes Relative: 12 %
Lymphs Abs: 0.7 10*3/uL — ABNORMAL LOW (ref 1.0–3.6)
MCH: 31 pg (ref 26.0–34.0)
MCHC: 34.1 g/dL (ref 32.0–36.0)
MCV: 90.9 fL (ref 80.0–100.0)
Monocytes Absolute: 0.5 10*3/uL (ref 0.2–1.0)
Monocytes Relative: 9 %
Neutro Abs: 4.7 10*3/uL (ref 1.4–6.5)
Neutrophils Relative %: 78 %
Platelets: 200 10*3/uL (ref 150–440)
RBC: 4.14 MIL/uL — ABNORMAL LOW (ref 4.40–5.90)
RDW: 13 % (ref 11.5–14.5)
WBC: 5.9 10*3/uL (ref 3.8–10.6)

## 2017-12-04 LAB — BRAIN NATRIURETIC PEPTIDE: B Natriuretic Peptide: 352 pg/mL — ABNORMAL HIGH (ref 0.0–100.0)

## 2017-12-04 LAB — TROPONIN I: Troponin I: <0.03 ng/mL (ref <0.03)

## 2017-12-04 LAB — GLUCOSE, CAPILLARY: Glucose-Capillary: 68 mg/dL (ref 65–99)

## 2017-12-04 MED ORDER — HEPARIN SODIUM (PORCINE) 5000 UNIT/ML IJ SOLN
5000.0000 [IU] | Freq: Three times a day (TID) | INTRAMUSCULAR | Status: DC
Start: 2017-12-05 — End: 2017-12-06
  Administered 2017-12-05 – 2017-12-06 (×3): 5000 [IU] via SUBCUTANEOUS
  Filled 2017-12-04 (×3): qty 1

## 2017-12-04 MED ORDER — SODIUM CHLORIDE 0.9 % IV BOLUS (SEPSIS)
1000.0000 mL | Freq: Once | INTRAVENOUS | Status: AC
  Administered 2017-12-04: 1000 mL via INTRAVENOUS

## 2017-12-04 MED ORDER — FERROUS SULFATE 325 (65 FE) MG PO TABS
325.0000 mg | ORAL_TABLET | Freq: Every day | ORAL | Status: DC
  Administered 2017-12-06: 325 mg via ORAL
  Filled 2017-12-04: qty 1

## 2017-12-04 MED ORDER — ONDANSETRON HCL 4 MG/2ML IJ SOLN
4.0000 mg | Freq: Four times a day (QID) | INTRAMUSCULAR | Status: DC | PRN
Start: 2017-12-04 — End: 2017-12-06
  Filled 2017-12-04: qty 2

## 2017-12-04 MED ORDER — ASPIRIN EC 325 MG PO TBEC
325.0000 mg | DELAYED_RELEASE_TABLET | Freq: Every day | ORAL | Status: DC
  Administered 2017-12-05 (×2): 325 mg via ORAL
  Filled 2017-12-04 (×2): qty 1

## 2017-12-04 MED ORDER — ACETAMINOPHEN 325 MG PO TABS: 650.0000 mg | ORAL_TABLET | Freq: Four times a day (QID) | ORAL | Status: DC | PRN

## 2017-12-04 MED ORDER — ONDANSETRON HCL 4 MG PO TABS: 4.0000 mg | ORAL_TABLET | Freq: Four times a day (QID) | ORAL | Status: DC | PRN

## 2017-12-04 MED ORDER — ACETAMINOPHEN 650 MG RE SUPP: 650.0000 mg | Freq: Four times a day (QID) | RECTAL | Status: DC | PRN

## 2017-12-04 MED ORDER — DOCUSATE SODIUM 100 MG PO CAPS
100.0000 mg | ORAL_CAPSULE | Freq: Two times a day (BID) | ORAL | Status: DC
  Administered 2017-12-05 (×2): 100 mg via ORAL
  Filled 2017-12-04 (×2): qty 1

## 2017-12-04 MED ORDER — LORATADINE 10 MG PO TABS
10.0000 mg | ORAL_TABLET | Freq: Every day | ORAL | Status: DC
  Administered 2017-12-06: 10 mg via ORAL
  Filled 2017-12-04: qty 1

## 2017-12-04 MED ORDER — FLUTICASONE PROPIONATE 50 MCG/ACT NA SUSP
2.0000 | Freq: Every day | NASAL | Status: DC | PRN
  Filled 2017-12-04: qty 16

## 2017-12-04 MED ORDER — BISACODYL 5 MG PO TBEC: 5.0000 mg | DELAYED_RELEASE_TABLET | Freq: Every day | ORAL | Status: DC | PRN

## 2017-12-04 MED ORDER — LISINOPRIL 5 MG PO TABS: 5.0000 mg | ORAL_TABLET | Freq: Every day | ORAL | Status: DC

## 2017-12-04 MED ORDER — METOPROLOL SUCCINATE ER 50 MG PO TB24: 50.0000 mg | ORAL_TABLET | Freq: Every day | ORAL | Status: DC

## 2017-12-04 MED ORDER — TRAZODONE HCL 50 MG PO TABS: 25.0000 mg | ORAL_TABLET | Freq: Every evening | ORAL | Status: DC | PRN

## 2017-12-04 MED ORDER — HYDROCODONE-ACETAMINOPHEN 5-325 MG PO TABS: 1.0000 | ORAL_TABLET | ORAL | Status: DC | PRN

## 2017-12-04 MED ORDER — PRAVASTATIN SODIUM 20 MG PO TABS
10.0000 mg | ORAL_TABLET | Freq: Every day | ORAL | Status: DC
  Administered 2017-12-05: 10 mg via ORAL
  Filled 2017-12-04: qty 1

## 2017-12-04 MED ORDER — LEVOTHYROXINE SODIUM 88 MCG PO TABS
88.0000 ug | ORAL_TABLET | Freq: Every day | ORAL | Status: DC
  Administered 2017-12-06: 88 ug via ORAL
  Filled 2017-12-04 (×2): qty 1

## 2017-12-04 NOTE — H&P (Signed)
[facility] - [place] [facility]   PATIENT NAME: Glenn [name]    MR#:  [id number]  DATE OF BIRTH:  12/23/1933  DATE OF ADMISSION:  12/04/2017  PRIMARY CARE PHYSICIAN: Raynelle Bring   REQUESTING/REFERRING PHYSICIAN:   CHIEF COMPLAINT:   Chief Complaint  Patient presents with  . Dizziness    HISTORY OF PRESENT ILLNESS: Glenn [name]  is a 84 y.o. male with a known history of hypertension, coronary artery disease, status post CABG in 2008 and sick sinus syndrome status post pacemaker placement.  Patient is also status post AVR and mitral valve repair both done during CABG surgery. Patient was brought to emergency room for an acute episode of lightheadedness at home, which happened just before arrival to the hospital.  EMS found him with a heart rate in low 30s. While in the emergency room patient's heart rate continues to drop in 20s, although patient remains clinically stable.  Patient denies any chest pain or shortness of breath, no nausea/vomiting, no abdominal pain/diarrhea, no bleeding.  Patient states he is compliant with his medications and follows with cardiology regularly. Blood test done in the emergency room are remarkable for hyponatremia at 126 and slightly elevated creatinine level of 1.47.  Chest x-rays noted without any acute changes. Patient is admitted to intensive care unit for further evaluation and treatment.   PAST MEDICAL HISTORY:   Past Medical History:  Diagnosis Date  . CAD (coronary artery disease)    a. 12/2006 CABG x 3: LIMA->LAD, VG->PDA, VG->OM.  Marland Kitchen Constipation   . Elevated PSA   . Enlarged prostate   . H/O aortic valve replacement    a. 12/2006 Tissue AVR @ time of CABG.  . H/O mitral valve replacement    a. 12/2006 MV repair @ time of CABG.  . High cholesterol   . Hypertension   . Sick sinus syndrome (HCC)    a. 12/2006 s/p MDT dual chamber PPM.  . Thyroid disease     PAST SURGICAL HISTORY:  Past Surgical  History:  Procedure Laterality Date  . AoV  replacement    . CORONARY ARTERY BYPASS GRAFT    . INTRAOCULAR LENS INSERTION  2007  . MEDIAL PARTIAL KNEE REPLACEMENT  2011  . PACEMAKER INSERTION  2008  . TURP VAPORIZATION  2010    SOCIAL HISTORY:  Social History   Tobacco Use  . Smoking status: Former Smoker    Packs/day: 1.00    Years: 15.00    Pack years: 15.00    Types: Cigarettes    Last attempt to quit: 07/06/1967    Years since quitting: 50.4  . Smokeless tobacco: Never Used  . Tobacco comment: Quit over 38 years ago  Substance Use Topics  . Alcohol use: No    Alcohol/week: 0.0 oz    Comment: Occasional use in the past.    FAMILY HISTORY:  Family History  Problem Relation Age of Onset  . Heart attack Father   . Hypertension Father   . Stroke Mother   . Cancer Neg Hx        Kidney, Bladder, Prostate    DRUG ALLERGIES: No Known Allergies  REVIEW OF SYSTEMS:   CONSTITUTIONAL: No fever, fatigue or weakness.  EYES: No vision changes.  EARS, NOSE, AND THROAT: No tinnitus or ear pain.  RESPIRATORY: No cough, shortness of breath, wheezing or hemoptysis.  CARDIOVASCULAR: Positive for lightheadedness, but no chest pain, orthopnea, edema.  GASTROINTESTINAL: No nausea, vomiting, diarrhea or abdominal  pain.  GENITOURINARY: No dysuria, hematuria.  ENDOCRINE: No polyuria, nocturia,  HEMATOLOGY: No bleeding SKIN: No rash or lesion. MUSCULOSKELETAL: No joint pain.   NEUROLOGIC: No focal weakness.  PSYCHIATRY: No anxiety or depression.   MEDICATIONS AT HOME:  Prior to Admission medications   Medication Sig Start Date End Date Taking? Authorizing Provider  aspirin EC 325 MG tablet Take 325 mg by mouth at bedtime.     [provider]  azelastine (ASTELIN) 0.1 % nasal spray Place into the nose. 05/20/16 05/26/17  [provider]  cetirizine (ZYRTEC) 10 MG tablet Take by mouth. 09/22/15 05/26/17  [provider]  cetirizine (ZYRTEC) 10 MG tablet TAKE 1  TABLET BY MOUTH ONCE DAILY. 06/11/16   [provider]  ferrous sulfate 325 (65 FE) MG tablet Take 325 mg by mouth daily with breakfast.    [provider]  fluticasone (FLONASE) 50 MCG/ACT nasal spray Place 2 sprays into both nostrils daily as needed for allergies.  05/10/14   [provider]  Inulin (FIBER CHOICE PO) Take 2 capsules by mouth daily.    [provider]  levothyroxine (SYNTHROID, LEVOTHROID) 75 MCG tablet  12/31/14   [provider]  levothyroxine (SYNTHROID, LEVOTHROID) 88 MCG tablet Take 88 mcg by mouth daily.  07/02/14   [provider]  lisinopril (PRINIVIL,ZESTRIL) 5 MG tablet  03/03/15   [provider]  lovastatin (MEVACOR) 40 MG tablet Take by mouth. 09/22/15   [provider]  metoprolol succinate (TOPROL-XL) 50 MG 24 hr tablet Take 50 mg by mouth daily.  06/18/14   [provider]  Multiple Vitamin (MULTI-VITAMINS) TABS Take by mouth.    [provider]  tiotropium (SPIRIVA HANDIHALER) 18 MCG inhalation capsule INHALE 1 CAPSULE AS DIRECTED ONCE DAILY. 08/10/16   [provider]  tiotropium (SPIRIVA) 18 MCG inhalation capsule Place into inhaler and inhale. 09/24/15 05/26/17  [provider]      PHYSICAL EXAMINATION:   VITAL SIGNS: Blood pressure (!) 150/57, pulse (!) 46, temperature (!) 97.5 F (36.4 C), resp. rate 18, height 5\' 8"  (1.727 m), weight 72.6 kg (160 lb), SpO2 100 %.  GENERAL:  84 y.o.-year-old patient lying in the bed with no acute distress.  EYES: Pupils equal, round, reactive to light and accommodation. No scleral icterus. Extraocular muscles intact.  HEENT: Head atraumatic, normocephalic. Oropharynx and nasopharynx clear.  NECK:  Supple, no jugular venous distention. No thyroid enlargement, no tenderness.  LUNGS: Reduced breath sounds bilaterally, no wheezing. No use of accessory muscles of respiration.  CARDIOVASCULAR: S1, S2 normal. No S3/S4.  ABDOMEN:  Soft, nontender, nondistended. Bowel sounds present. No organomegaly or mass.  EXTREMITIES: No pedal edema, cyanosis, or clubbing.  NEUROLOGIC: No focal weakness. Gait not checked, due to dizziness.  PSYCHIATRIC: The patient is alert and oriented x 3.  SKIN: No obvious rash, lesion, or ulcer.   LABORATORY PANEL:   CBC Recent Labs  Lab 12/04/17 2115  WBC 5.9  HGB 12.9*  HCT 37.7*  PLT 200  MCV 90.9  MCH 31.0  MCHC 34.1  RDW 13.0  LYMPHSABS 0.7*  MONOABS 0.5  EOSABS 0.0  BASOSABS 0.0   ------------------------------------------------------------------------------------------------------------------  Chemistries  No results for input(s): NA, K, CL, CO2, GLUCOSE, BUN, CREATININE, CALCIUM, MG, AST, ALT, ALKPHOS, BILITOT in the last 168 hours.  Invalid input(s): GFRCGP ------------------------------------------------------------------------------------------------------------------ CrCl cannot be calculated (Patient's most recent lab result is older than the maximum 21 days allowed.). ------------------------------------------------------------------------------------------------------------------ No results for input(s): TSH, T4TOTAL,  T3FREE, THYROIDAB in the last 72 hours.  Invalid input(s): FREET3   Coagulation profile No results for input(s): INR, PROTIME in the last 168 hours. ------------------------------------------------------------------------------------------------------------------- No results for input(s): DDIMER in the last 72 hours. -------------------------------------------------------------------------------------------------------------------  Cardiac Enzymes No results for input(s): CKMB, TROPONINI, MYOGLOBIN in the last 168 hours.  Invalid input(s): CK ------------------------------------------------------------------------------------------------------------------ Invalid input(s):  POCBNP  ---------------------------------------------------------------------------------------------------------------  Urinalysis    Component Value Date/Time   COLORURINE YELLOW 07/05/2014 1749   APPEARANCEUR HAZY (A) 07/05/2014 1749   LABSPEC 1.020 07/05/2014 1749   PHURINE 7.5 07/05/2014 1749   GLUCOSEU NEGATIVE 07/05/2014 1749   HGBUR NEGATIVE 07/05/2014 1749   BILIRUBINUR NEGATIVE 07/05/2014 1749   KETONESUR NEGATIVE 07/05/2014 1749   PROTEINUR NEGATIVE 07/05/2014 1749   UROBILINOGEN 0.2 07/05/2014 1749   NITRITE NEGATIVE 07/05/2014 1749   LEUKOCYTESUR SMALL (A) 07/05/2014 1749     RADIOLOGY: Dg Chest Port 1 View  Result Date: 12/04/2017 CLINICAL DATA:  Dizziness.  Bradycardia. EXAM: PORTABLE CHEST 1 VIEW COMPARISON:  May 25, 2017 FINDINGS: The patient has chronic loculated left-sided pleural effusion is stable. The heart, hila, and mediastinum are unchanged. No pneumothorax. A transcutaneous pacer overlies left side of the heart. A pacemaker is identified. No other acute abnormalities. IMPRESSION: 1. Chronic loculated left-sided pleural effusion. 2. No other acute abnormalities. Electronically Signed   By: Gerome Sam III M.D   On: 12/04/2017 22:00    EKG: Orders placed or performed during the hospital encounter of 12/04/17  . ED EKG  . ED EKG    IMPRESSION AND PLAN:  1.  Severe persistent bradycardia, with pacemaker malfunction.  Will transfer patient to intensive care unit for transcutaneous pacer placement.  Continue to monitor patient's clinically closely.  Continue to monitor on telemetry.  Cardiology and intensivist are consulted. 2.  Hyponatremia, could be related to mild hypovolemia.  We will start gentle hydration with normal saline  and monitor sodium level closely. 3.  Coronary artery disease status post CABG in the past, stable, continue medical management. 4.  CKD 2, creatinine level is 1.47, which is baseline.  Continue to monitor kidney function  closely and avoid nephrotoxic medications.   All the records are reviewed and case discussed with ED provider. Management plans discussed with the patient, family and they are in agreement.  CODE STATUS: Code Status History    Date Active Date Inactive Code Status Order ID Comments User Context   07/05/2014 20:49 07/06/2014 19:59 Full Code [id number]  Annett Gula Inpatient       TOTAL TIME TAKING CARE OF THIS PATIENT: 40 minutes.    Cammy Copa M.D on 12/04/2017 at 10:14 PM  Between 7am to 6pm - Pager - [phone]  After 6pm go to [email or web address] - password [email or web address] [facility]  [facility]  Office  [phone]  CC: Primary care physician; Raynelle Bring

## 2017-12-04 NOTE — ED Notes (Signed)
Pt reports feeling weak.  Pt talking skin warm and dry.  Iv fluids infusing.  Sinus brady on monitor.

## 2017-12-04 NOTE — ED Notes (Signed)
md in with pt and family.  Heart rate 34.  Sinus brady on monitor.

## 2017-12-04 NOTE — ED Notes (Signed)
Pt waiting on admission.  Pt alert.  Family with pt. Sinus brady on monitor.

## 2017-12-04 NOTE — ED Provider Notes (Signed)
[facility] Emergency Department Provider Note  ____________________________________________   First MD Initiated Contact with Patient 12/04/17 2117     (approximate)  I have reviewed the triage vital signs and the nursing notes.   HISTORY  Chief Complaint Dizziness   HPI Glenn [name] is a 84 y.o. male who comes to the emergency department via EMS with 1 day of gradually progressive insidious onset now severe lightheadedness.  He feels lightheaded and like he is going to pass out whenever he stands up.  He has no pain.  He does have a past medical history of coronary artery disease and is status post dual-chamber pacemaker.  He says he has been told that his pacemaker battery is running out but he has not yet gotten it changed.  Past Medical History:  Diagnosis Date  . CAD (coronary artery disease)    a. 12/2006 CABG x 3: LIMA->LAD, VG->PDA, VG->OM.  Marland Kitchen Constipation   . Elevated PSA   . Enlarged prostate   . H/O aortic valve replacement    a. 12/2006 Tissue AVR @ time of CABG.  . H/O mitral valve replacement    a. 12/2006 MV repair @ time of CABG.  . High cholesterol   . Hypertension   . Pleural effusion, left 2009   Chronic  . Sick sinus syndrome (HCC)    a. 12/2006 s/p MDT dual chamber PPM.  . Thyroid disease     Patient Active Problem List   Diagnosis Date Noted  . Bradycardia 12/04/2017  . Persistent severe sinus bradycardia 12/04/2017  . History of elevated PSA 09/28/2015  . BPH with obstruction/lower urinary tract symptoms 03/30/2015  . CAFL (chronic airflow limitation) (HCC) 03/25/2015  . Aortic valve defect 07/05/2014  . Asthma with chronic obstructive pulmonary disease (COPD) (HCC) 07/05/2014  . CHF with unknown LVEF (HCC) 07/05/2014  . CAD (coronary artery disease) 07/05/2014  . HLD (hyperlipidemia) 07/05/2014  . Hypertensive urgency 07/05/2014  . Anemia, iron deficiency 07/05/2014  . Disorder of mitral valve 07/05/2014  .  Arthritis, degenerative 07/05/2014  . Sick sinus syndrome (HCC) 07/05/2014  . CKD (chronic kidney disease) stage 3, GFR 30-59 ml/min (HCC) 07/05/2014  . Chronic kidney disease (CKD), stage III (moderate) (HCC) 07/05/2014  . CCF (congestive cardiac failure) (HCC) 07/05/2014  . Atherosclerosis of coronary artery 07/05/2014  . Essential (primary) hypertension 07/05/2014  . BPH (benign prostatic hypertrophy) 05/03/2014  . Benign prostatic hypertrophy without urinary obstruction 05/03/2014  . Pleural effusion, left 10/19/2007    Past Surgical History:  Procedure Laterality Date  . AoV  replacement    . CORONARY ARTERY BYPASS GRAFT    . INTRAOCULAR LENS INSERTION  2007  . MEDIAL PARTIAL KNEE REPLACEMENT  2011  . PACEMAKER INSERTION  2008  . TURP VAPORIZATION  2010    Prior to Admission medications   Medication Sig Start Date End Date Taking? Authorizing Provider  aspirin EC 325 MG tablet Take 325 mg by mouth at bedtime.     [provider]  cetirizine (ZYRTEC) 10 MG tablet TAKE 1 TABLET BY MOUTH ONCE DAILY. 06/11/16   [provider]  ferrous sulfate 325 (65 FE) MG tablet Take 325 mg by mouth daily with breakfast.    [provider]  fluticasone (FLONASE) 50 MCG/ACT nasal spray Place 2 sprays into both nostrils daily as needed for allergies.  05/10/14   [provider]  Inulin (FIBER CHOICE PO) Take 2 capsules by mouth daily.    [provider]  levothyroxine (SYNTHROID, LEVOTHROID) 88 MCG tablet Take 88 mcg by mouth daily.  07/02/14   [provider]  lisinopril (PRINIVIL,ZESTRIL) 5 MG tablet  03/03/15   [provider]  lovastatin (MEVACOR) 40 MG tablet Take by mouth. 09/22/15   [provider]  Multiple Vitamin (MULTI-VITAMINS) TABS Take by mouth.    [provider]  tiotropium (SPIRIVA HANDIHALER) 18 MCG inhalation capsule INHALE 1 CAPSULE AS DIRECTED ONCE DAILY. 08/10/16   [provider]     Allergies Patient has no known allergies.  Family History  Problem Relation Age of Onset  . Heart attack Father   . Hypertension Father   . Stroke Mother   . Cancer Neg Hx        Kidney, Bladder, Prostate    Social History Social History   Tobacco Use  . Smoking status: Former Smoker    Packs/day: 1.00    Years: 15.00    Pack years: 15.00    Types: Cigarettes    Last attempt to quit: 07/06/1967    Years since quitting: 50.4  . Smokeless tobacco: Never Used  . Tobacco comment: Quit over 38 years ago  Substance Use Topics  . Alcohol use: No    Alcohol/week: 0.0 oz    Comment: Occasional use in the past.  . Drug use: No    Review of Systems Constitutional: No fever/chills Eyes: No visual changes. ENT: No sore throat. Cardiovascular: Denies chest pain. Respiratory: Positive for shortness of breath. Gastrointestinal: No abdominal pain.  No nausea, no vomiting.  No diarrhea.  No constipation. Genitourinary: Negative for dysuria. Musculoskeletal: Negative for back pain. Skin: Negative for rash. Neurological: Negative for headaches, focal weakness or numbness.   ____________________________________________   PHYSICAL EXAM:  VITAL SIGNS: ED Triage Vitals  Enc Vitals Group     BP 12/04/17 2114 (!) 165/63     Pulse Rate 12/04/17 2114 (!) 35     Resp 12/04/17 2114 20     Temp 12/04/17 2114 (!) 97.5 F (36.4 C)     Temp src --      SpO2 12/04/17 2114 100 %     Weight 12/04/17 2116 160 lb (72.6 kg)     Height 12/04/17 2116 5\' 8"  (1.727 m)     Head Circumference --      Peak Flow --      Pain Score --      Pain Loc --      Pain Edu? --      Excl. in GC? --     Constitutional: Alert and oriented x4 pleasant cooperative nontoxic Eyes: PERRL EOMI. Head: Atraumatic. Nose: No congestion/rhinnorhea. Mouth/Throat: No trismus Neck: No stridor.   Cardiovascular: Irregularly irregular and bradycardic Respiratory: Normal respiratory effort.  No retractions.  Lungs CTAB and moving good air Gastrointestinal: Soft nontender Musculoskeletal: No lower extremity edema   Neurologic:  Normal speech and language. No gross focal neurologic deficits are appreciated. Skin:  Skin is warm, dry and intact. No rash noted. Psychiatric: Mood and affect are normal. Speech and behavior are normal.    ____________________________________________   DIFFERENTIAL includes but not limited to  Failure to capture, failure to pace, hyperkalemia, acute coronary syndrome, dehydration ____________________________________________   LABS (all labs ordered are listed, but only abnormal results are displayed)  Labs Reviewed  URINE CULTURE - Abnormal; Notable for the following components:      Result Value   Culture MULTIPLE SPECIES PRESENT, SUGGEST RECOLLECTION (*)  All other components within normal limits  COMPREHENSIVE METABOLIC PANEL - Abnormal; Notable for the following components:   Sodium 126 (*)    Chloride 94 (*)    Glucose, Bld 100 (*)    Creatinine, Ser 1.47 (*)    Calcium 10.4 (*)    AST 44 (*)    GFR calc non Af Amer 43 (*)    GFR calc Af Amer 50 (*)    All other components within normal limits  BRAIN NATRIURETIC PEPTIDE - Abnormal; Notable for the following components:   B Natriuretic Peptide 352.0 (*)    All other components within normal limits  CBC WITH DIFFERENTIAL/PLATELET - Abnormal; Notable for the following components:   RBC 4.14 (*)    Hemoglobin 12.9 (*)    HCT 37.7 (*)    Lymphs Abs 0.7 (*)    All other components within normal limits  BASIC METABOLIC PANEL - Abnormal; Notable for the following components:   Sodium 127 (*)    Chloride 96 (*)    CO2 19 (*)    Glucose, Bld 136 (*)    BUN 22 (*)    Creatinine, Ser 1.58 (*)    GFR calc non Af Amer 39 (*)    GFR calc Af Amer 46 (*)    All other components within normal limits  URINALYSIS, ROUTINE W REFLEX MICROSCOPIC - Abnormal; Notable for the following components:   Color,  Urine YELLOW (*)    APPearance CLOUDY (*)    Glucose, UA 50 (*)    Hgb urine dipstick SMALL (*)    Protein, ur 30 (*)    Leukocytes, UA MODERATE (*)    Bacteria, UA RARE (*)    Squamous Epithelial / LPF 0-5 (*)    All other components within normal limits  TROPONIN I - Abnormal; Notable for the following components:   Troponin I 0.04 (*)    All other components within normal limits  GLUCOSE, CAPILLARY - Abnormal; Notable for the following components:   Glucose-Capillary 113 (*)    All other components within normal limits  GLUCOSE, CAPILLARY - Abnormal; Notable for the following components:   Glucose-Capillary 102 (*)    All other components within normal limits  CBC - Abnormal; Notable for the following components:   RBC 3.42 (*)    Hemoglobin 10.6 (*)    HCT 31.5 (*)    Platelets 139 (*)    All other components within normal limits  BASIC METABOLIC PANEL - Abnormal; Notable for the following components:   Sodium 126 (*)    Chloride 98 (*)    CO2 20 (*)    BUN 28 (*)    Creatinine, Ser 1.46 (*)    GFR calc non Af Amer 43 (*)    GFR calc Af Amer 50 (*)    All other components within normal limits  GLUCOSE, CAPILLARY - Abnormal; Notable for the following components:   Glucose-Capillary 107 (*)    All other components within normal limits  MRSA PCR SCREENING  TROPONIN I  GLUCOSE, CAPILLARY  CBC  TROPONIN I  MAGNESIUM  PHOSPHORUS  PROTIME-INR  TROPONIN I  TROPONIN I  TSH  MAGNESIUM  PHOSPHORUS    Lab work reviewed by me with no acute disease __________________________________________  EKG  ED ECG REPORT I, Merrily Brittle, the attending physician, personally viewed and interpreted this ECG.  Date: 12/05/2017 EKG Time:  Rate: 33 Rhythm: Complete heart block with idioventricular rhythm QRS Axis: normal Intervals: normal  ST/T Wave abnormalities: lateral TWI chronic Narrative Interpretation: no evidence of acute  ischemia  ____________________________________________  RADIOLOGY  Chest x-ray reviewed by me with no acute disease ____________________________________________   PROCEDURES Procedure(s) performed: no  .Critical Care Performed by: Merrily Brittle, MD Authorized by: Merrily Brittle, MD   Critical care provider statement:    Critical care time (minutes):  40   Critical care time was exclusive of:  Separately billable procedures and treating other patients   Critical care was necessary to treat or prevent imminent or life-threatening deterioration of the following conditions:  Cardiac failure and circulatory failure   Critical care was time spent personally by me on the following activities:  Development of treatment plan with patient or surrogate, discussions with consultants, evaluation of patient's response to treatment, examination of patient, obtaining history from patient or surrogate, ordering and performing treatments and interventions, ordering and review of laboratory studies, ordering and review of radiographic studies, pulse oximetry, re-evaluation of patient's condition and review of old charts    Critical Care performed: no  Observation: no ____________________________________________   INITIAL IMPRESSION / ASSESSMENT AND PLAN / ED COURSE  Pertinent labs & imaging results that were available during my care of the patient were reviewed by me and considered in my medical decision making (see chart for details).  On arrival the patient is symptomatic with heart rate into the low 30s.  As he arrives his pacemaker is intermittently functioning but after several minutes it appears to not be functioning whatsoever.  We are unable to interrogate the pacer which is consistent with battery malfunction.  While the patient is symptomatic when he stands up or moves while lying in bed he is relatively comfortable.  I placed a call out to on-call cardiology who recommends ICU level  care and pacemaker change tomorrow.  I then discussed with the hospitalist who is graciously agreed to admit the patient to his service.      ____________________________________________   FINAL CLINICAL IMPRESSION(S) / ED DIAGNOSES  Final diagnoses:  Symptomatic bradycardia  Complete heart block (HCC)      NEW MEDICATIONS STARTED DURING THIS VISIT:  Discharge Medication List as of 12/06/2017 11:03 AM       Note:  This document was prepared using Dragon voice recognition software and may include unintentional dictation errors.     Merrily Brittle, MD 12/07/17 [phone]

## 2017-12-04 NOTE — ED Notes (Signed)
medtronic pacemaker... Tried to interrogate pacemaker but machine would not work.  Dr Lamont Snowball aware.

## 2017-12-04 NOTE — ED Notes (Signed)
Dr. Caryn Bee and nursing sup Dewayne Hatch rn contacted concerning telemetry bed,  Requesting pt to be in icu with heart rate of 27.  Er md aware as well.

## 2017-12-04 NOTE — ED Notes (Signed)
Prime doc in with pt.  

## 2017-12-04 NOTE — ED Notes (Signed)
Pt reports dizziness and sob.

## 2017-12-04 NOTE — ED Notes (Signed)
Pt brought in via ems from home with dizziness.  Heart rate 35 on arrival to er treatment room  Pt has a pacemaker.  md at bedside. Pt denies chest pain, pt has sob.  Pt alert.  Skin warm and dry.   Iv started stat and labs sent. Cardiac pads in place on pt's chest.

## 2017-12-04 NOTE — ED Triage Notes (Signed)
Pt brought in via ems from home with dizziness.  Pt has a pacemaker and heart rate was 30 on the scene. Pt alert.  md at bedside.  Skin warm and dry.

## 2017-12-04 NOTE — ED Notes (Signed)
Family in room taking pictures on cell phone of monitor.  Family informed HIPPA violation and not to take pictures.  Charge  Nurse brandy rn aware.

## 2017-12-04 NOTE — ED Notes (Signed)
Report called to amber rn icu nurse

## 2017-12-05 ENCOUNTER — Inpatient Hospital Stay: Payer: Medicare Other

## 2017-12-05 ENCOUNTER — Inpatient Hospital Stay: Payer: Medicare Other | Admitting: Anesthesiology

## 2017-12-05 ENCOUNTER — Encounter
Admission: EM | Disposition: A | Discharge status: Home / Self Care | Payer: Self-pay | Source: Home / Self Care | Attending: Internal Medicine

## 2017-12-05 ENCOUNTER — Encounter: Payer: Self-pay | Admitting: Pulmonary Disease

## 2017-12-05 ENCOUNTER — Other Ambulatory Visit: Payer: Self-pay

## 2017-12-05 DIAGNOSIS — J90 Pleural effusion, not elsewhere classified: Secondary | ICD-10-CM

## 2017-12-05 DIAGNOSIS — R9431 Abnormal electrocardiogram [ECG] [EKG]: Secondary | ICD-10-CM

## 2017-12-05 DIAGNOSIS — R001 Bradycardia, unspecified: Secondary | ICD-10-CM

## 2017-12-05 LAB — CBC
HCT: 41.8 % (ref 40.0–52.0)
Hemoglobin: 14.3 g/dL (ref 13.0–18.0)
MCH: 31.2 pg (ref 26.0–34.0)
MCHC: 34.3 g/dL (ref 32.0–36.0)
MCV: 91 fL (ref 80.0–100.0)
Platelets: 206 10*3/uL (ref 150–440)
RBC: 4.59 MIL/uL (ref 4.40–5.90)
RDW: 13.2 % (ref 11.5–14.5)
WBC: 8.9 10*3/uL (ref 3.8–10.6)

## 2017-12-05 LAB — URINALYSIS, ROUTINE W REFLEX MICROSCOPIC
Bilirubin Urine: NEGATIVE
Glucose, UA: 50 mg/dL — AB
Ketones, ur: NEGATIVE mg/dL
Nitrite: NEGATIVE
Protein, ur: 30 mg/dL — AB
Specific Gravity, Urine: 1.014 (ref 1.005–1.030)
pH: 6 (ref 5.0–8.0)

## 2017-12-05 LAB — BASIC METABOLIC PANEL
Anion gap: 12 (ref 5–15)
BUN: 22 mg/dL — ABNORMAL HIGH (ref 6–20)
CO2: 19 mmol/L — ABNORMAL LOW (ref 22–32)
Calcium: 10.3 mg/dL (ref 8.9–10.3)
Chloride: 96 mmol/L — ABNORMAL LOW (ref 101–111)
Creatinine, Ser: 1.58 mg/dL — ABNORMAL HIGH (ref 0.61–1.24)
GFR calc Af Amer: 46 mL/min — ABNORMAL LOW (ref >60)
GFR calc non Af Amer: 39 mL/min — ABNORMAL LOW (ref >60)
Glucose, Bld: 136 mg/dL — ABNORMAL HIGH (ref 65–99)
Potassium: 4.7 mmol/L (ref 3.5–5.1)
Sodium: 127 mmol/L — ABNORMAL LOW (ref 135–145)

## 2017-12-05 LAB — TSH: TSH: 0.51 u[IU]/mL (ref 0.350–4.500)

## 2017-12-05 LAB — TROPONIN I
Troponin I: <0.03 ng/mL (ref <0.03)
Troponin I: 0.04 ng/mL (ref <0.03)
Troponin I: <0.03 ng/mL (ref <0.03)
Troponin I: <0.03 ng/mL (ref <0.03)

## 2017-12-05 LAB — PROTIME-INR
INR: 1.13
Prothrombin Time: 14.4 seconds (ref 11.4–15.2)

## 2017-12-05 LAB — MAGNESIUM: Magnesium: 2 mg/dL (ref 1.7–2.4)

## 2017-12-05 LAB — PHOSPHORUS: Phosphorus: 2.8 mg/dL (ref 2.5–4.6)

## 2017-12-05 LAB — MRSA PCR SCREENING: MRSA by PCR: NEGATIVE

## 2017-12-05 LAB — GLUCOSE, CAPILLARY
Glucose-Capillary: 113 mg/dL — ABNORMAL HIGH (ref 65–99)
Glucose-Capillary: 102 mg/dL — ABNORMAL HIGH (ref 65–99)

## 2017-12-05 SURGERY — PACEMAKER GENERATOR CHANGE
Anesthesia: Monitor Anesthesia Care | Site: Chest | Wound class: Clean

## 2017-12-05 MED ORDER — ONDANSETRON HCL 4 MG/2ML IJ SOLN
4.0000 mg | Freq: Once | INTRAMUSCULAR | Status: DC | PRN
Start: 2017-12-05 — End: 2017-12-05

## 2017-12-05 MED ORDER — PHENYLEPHRINE HCL 10 MG/ML IJ SOLN
INTRAMUSCULAR | Status: DC | PRN
  Administered 2017-12-05: 100 ug via INTRAVENOUS

## 2017-12-05 MED ORDER — GENTAMICIN SULFATE 40 MG/ML IJ SOLN
INTRAMUSCULAR | Status: AC
  Filled 2017-12-05: qty 2

## 2017-12-05 MED ORDER — FENTANYL CITRATE (PF) 100 MCG/2ML IJ SOLN: 25.0000 ug | INTRAMUSCULAR | Status: DC | PRN

## 2017-12-05 MED ORDER — ONDANSETRON HCL 4 MG/2ML IJ SOLN: 4.0000 mg | Freq: Four times a day (QID) | INTRAMUSCULAR | Status: DC | PRN

## 2017-12-05 MED ORDER — DOPAMINE-DEXTROSE 3.2-5 MG/ML-% IV SOLN
0.0000 ug/kg/min | INTRAVENOUS | Status: DC
  Administered 2017-12-05: 5 ug/kg/min via INTRAVENOUS

## 2017-12-05 MED ORDER — GENTAMICIN SULFATE 40 MG/ML IJ SOLN
INTRAMUSCULAR | Status: DC | PRN
  Administered 2017-12-05: 450 mL

## 2017-12-05 MED ORDER — ALUM & MAG HYDROXIDE-SIMETH 200-200-20 MG/5ML PO SUSP
30.0000 mL | ORAL | Status: DC | PRN
  Filled 2017-12-05: qty 30

## 2017-12-05 MED ORDER — ATROPINE SULFATE 1 MG/10ML IJ SOSY
PREFILLED_SYRINGE | INTRAMUSCULAR | Status: AC
  Filled 2017-12-05: qty 10

## 2017-12-05 MED ORDER — DOPAMINE-DEXTROSE 3.2-5 MG/ML-% IV SOLN
INTRAVENOUS | Status: AC
  Administered 2017-12-05: 5 ug/kg/min via INTRAVENOUS
  Filled 2017-12-05: qty 250

## 2017-12-05 MED ORDER — FENTANYL CITRATE (PF) 100 MCG/2ML IJ SOLN
INTRAMUSCULAR | Status: AC
  Filled 2017-12-05: qty 2

## 2017-12-05 MED ORDER — ACETAMINOPHEN 325 MG PO TABS: 325.0000 mg | ORAL_TABLET | ORAL | Status: DC | PRN

## 2017-12-05 MED ORDER — FENTANYL CITRATE (PF) 100 MCG/2ML IJ SOLN
INTRAMUSCULAR | Status: DC | PRN
  Administered 2017-12-05: 25 ug via INTRAVENOUS

## 2017-12-05 MED ORDER — CEFAZOLIN SODIUM-DEXTROSE 1-4 GM/50ML-% IV SOLN
1.0000 g | Freq: Four times a day (QID) | INTRAVENOUS | Status: AC
  Administered 2017-12-05 – 2017-12-06 (×3): 1 g via INTRAVENOUS
  Filled 2017-12-05 (×3): qty 50

## 2017-12-05 MED ORDER — DOPAMINE-DEXTROSE 3.2-5 MG/ML-% IV SOLN
INTRAVENOUS | Status: AC
  Filled 2017-12-05: qty 250

## 2017-12-05 MED ORDER — PROPOFOL 10 MG/ML IV BOLUS
INTRAVENOUS | Status: AC
  Filled 2017-12-05: qty 20

## 2017-12-05 MED ORDER — SODIUM CHLORIDE 0.9 % IV BOLUS (SEPSIS)
300.0000 mL | Freq: Once | INTRAVENOUS | Status: AC
  Administered 2017-12-05: 300 mL via INTRAVENOUS

## 2017-12-05 MED ORDER — SODIUM CHLORIDE 0.9 % IV SOLN
INTRAVENOUS | Status: DC
  Administered 2017-12-05 (×3): via INTRAVENOUS

## 2017-12-05 MED ORDER — ATROPINE SULFATE 1 MG/10ML IJ SOSY: 1.0000 mg | PREFILLED_SYRINGE | INTRAMUSCULAR | Status: DC | PRN

## 2017-12-05 MED ORDER — DOPAMINE-DEXTROSE 3.2-5 MG/ML-% IV SOLN
0.0000 ug/kg/min | INTRAVENOUS | Status: DC
  Administered 2017-12-05: 20 ug/kg/min via INTRAVENOUS

## 2017-12-05 MED ORDER — LIDOCAINE 1 % OPTIME INJ - NO CHARGE
INTRAMUSCULAR | Status: DC | PRN
  Administered 2017-12-05: 25 mL

## 2017-12-05 SURGICAL SUPPLY — 34 items
BAG DECANTER FOR FLEXI CONT (MISCELLANEOUS) ×3 IMPLANT
BLADE PHOTON ILLUMINATED (MISCELLANEOUS) ×3 IMPLANT
BRUSH SCRUB EZ  4% CHG (MISCELLANEOUS) ×1
BRUSH SCRUB EZ 4% CHG (MISCELLANEOUS) ×2 IMPLANT
CABLE SURG 12 DISP A/V CHANNEL (MISCELLANEOUS) ×3 IMPLANT
CANISTER SUCT 1200ML W/VALVE (MISCELLANEOUS) ×3 IMPLANT
CHLORAPREP W/TINT 26ML (MISCELLANEOUS) ×3 IMPLANT
COVER LIGHT HANDLE STERIS (MISCELLANEOUS) ×6 IMPLANT
COVER MAYO STAND STRL (DRAPES) ×3 IMPLANT
DRAPE C-ARM XRAY 36X54 (DRAPES) ×3 IMPLANT
DRSG TEGADERM 4X4.75 (GAUZE/BANDAGES/DRESSINGS) ×3 IMPLANT
DRSG TELFA 4X3 1S NADH ST (GAUZE/BANDAGES/DRESSINGS) ×3 IMPLANT
ELECT REM PT RETURN 9FT ADLT (ELECTROSURGICAL) ×3
ELECTRODE REM PT RTRN 9FT ADLT (ELECTROSURGICAL) ×2 IMPLANT
GLOVE BIO SURGEON STRL SZ7.5 (GLOVE) ×3 IMPLANT
GLOVE BIO SURGEON STRL SZ8 (GLOVE) ×3 IMPLANT
GOWN STRL REUS W/ TWL LRG LVL3 (GOWN DISPOSABLE) ×2 IMPLANT
GOWN STRL REUS W/ TWL XL LVL3 (GOWN DISPOSABLE) ×2 IMPLANT
GOWN STRL REUS W/TWL LRG LVL3 (GOWN DISPOSABLE) ×1
GOWN STRL REUS W/TWL XL LVL3 (GOWN DISPOSABLE) ×1
IMMOBILIZER SHDR MD LX WHT (SOFTGOODS) IMPLANT
IMMOBILIZER SHDR XL LX WHT (SOFTGOODS) IMPLANT
INTRO PACEMKR SHEATH II 7FR (MISCELLANEOUS)
INTRODUCER PACEMKR SHTH II 7FR (MISCELLANEOUS) IMPLANT
IPG PACE AZUR XT DR MRI W1DR01 (Pacemaker) ×2 IMPLANT
IV NS 500ML (IV SOLUTION) ×1
IV NS 500ML BAXH (IV SOLUTION) ×2 IMPLANT
KIT TURNOVER KIT A (KITS) ×3 IMPLANT
LABEL OR SOLS (LABEL) ×3 IMPLANT
MARKER SKIN DUAL TIP RULER LAB (MISCELLANEOUS) ×3 IMPLANT
PACE AZURE XT DR MRI W1DR01 (Pacemaker) ×3 IMPLANT
PACK PACE INSERTION (MISCELLANEOUS) ×3 IMPLANT
PAD ONESTEP ZOLL R SERIES ADT (MISCELLANEOUS) IMPLANT
SUT SILK 0 SH 30 (SUTURE) IMPLANT

## 2017-12-05 NOTE — Consult Note (Signed)
[facility] Cardiology Consultation Note  Patient ID: Glenn [name], MRN: [id number], DOB/AGE: [birth year] 84 y.o. Admit date: 12/04/2017   Date of Consult: 12/05/2017 Primary Physician: Raynelle Bring Primary Cardiologist: Higinio Roger  Chief Complaint:  Chief Complaint  Patient presents with  . Dizziness   Reason for Consult: Bradycardia  HPI: 84 y.o. male with known coronary disease status post coronary bypass graft with LIMA to LAD saphenous vein graft to obtuse marginal and PDA in 2008 as well as aortic and mitral valve replacement with pericardial valves essential hypertension mixed hyperlipidemia on appropriate medication management with previous symptomatic heart block status post dual pain chamber pacemaker placement.  The patient has done very well recently with no evidence of significant new symptoms of chest pain shortness of breath or heart failure.  In the last 2 days he has had some significant weakness fatigue and dizziness and had 2 syncopal episodes.  When arriving in the emergency room the place patient has had normal sinus rhythm with complete heart block and a ventricular escape rhythm between 30 and 40 bpm.  The patient has had reasonable hemodynamic debility with no evidence of hypotension heart failure or myocardial infarction.  Troponin is 0.04 more consistent with demand ischemia rather than acute coronary syndrome.  Patient in the supine position has had no evidence of significant further symptoms of this issue.  Pacemaker interrogation shows no evidence of any battery life and cannot get detection.  Therefore patient likely needs battery exchange at this time.  Past Medical History:  Diagnosis Date  . CAD (coronary artery disease)    a. 12/2006 CABG x 3: LIMA->LAD, VG->PDA, VG->OM.  Marland Kitchen Constipation   . Elevated PSA   . Enlarged prostate   . H/O aortic valve replacement    a. 12/2006 Tissue AVR @ time of CABG.  . H/O mitral valve replacement    a. 12/2006  MV repair @ time of CABG.  . High cholesterol   . Hypertension   . Sick sinus syndrome (HCC)    a. 12/2006 s/p MDT dual chamber PPM.  . Thyroid disease       Surgical History:  Past Surgical History:  Procedure Laterality Date  . AoV  replacement    . CORONARY ARTERY BYPASS GRAFT    . INTRAOCULAR LENS INSERTION  2007  . MEDIAL PARTIAL KNEE REPLACEMENT  2011  . PACEMAKER INSERTION  2008  . TURP VAPORIZATION  2010     Home Meds: Prior to Admission medications   Medication Sig Start Date End Date Taking? Authorizing Provider  aspirin EC 325 MG tablet Take 325 mg by mouth at bedtime.     [provider]  azelastine (ASTELIN) 0.1 % nasal spray Place into the nose. 05/20/16 05/26/17  [provider]  cetirizine (ZYRTEC) 10 MG tablet Take by mouth. 09/22/15 05/26/17  [provider]  cetirizine (ZYRTEC) 10 MG tablet TAKE 1 TABLET BY MOUTH ONCE DAILY. 06/11/16   [provider]  ferrous sulfate 325 (65 FE) MG tablet Take 325 mg by mouth daily with breakfast.    [provider]  fluticasone (FLONASE) 50 MCG/ACT nasal spray Place 2 sprays into both nostrils daily as needed for allergies.  05/10/14   [provider]  Inulin (FIBER CHOICE PO) Take 2 capsules by mouth daily.    [provider]  levothyroxine (SYNTHROID, LEVOTHROID) 75 MCG tablet  12/31/14   [provider]  levothyroxine (SYNTHROID, LEVOTHROID) 88 MCG tablet Take 88 mcg by  mouth daily.  07/02/14   [provider]  lisinopril (PRINIVIL,ZESTRIL) 5 MG tablet  03/03/15   [provider]  lovastatin (MEVACOR) 40 MG tablet Take by mouth. 09/22/15   [provider]  metoprolol succinate (TOPROL-XL) 50 MG 24 hr tablet Take 50 mg by mouth daily.  06/18/14   [provider]  Multiple Vitamin (MULTI-VITAMINS) TABS Take by mouth.    [provider]  tiotropium (SPIRIVA HANDIHALER) 18 MCG inhalation capsule INHALE 1 CAPSULE AS DIRECTED ONCE  DAILY. 08/10/16   [provider]  tiotropium (SPIRIVA) 18 MCG inhalation capsule Place into inhaler and inhale. 09/24/15 05/26/17  [provider]    Inpatient Medications:  . aspirin EC  325 mg Oral QHS  . atropine      . docusate sodium  100 mg Oral BID  . ferrous sulfate  325 mg Oral Q breakfast  . heparin  5,000 Units Subcutaneous Q8H  . levothyroxine  88 mcg Oral QAC breakfast  . lisinopril  5 mg Oral Daily  . loratadine  10 mg Oral Daily  . metoprolol succinate  50 mg Oral Daily  . pravastatin  10 mg Oral q1800   . sodium chloride 75 mL/hr at 12/05/17 0730  . DOPamine 12 mcg/kg/min (12/05/17 0555)    Allergies: No Known Allergies  Social History   Socioeconomic History  . Marital status: Widowed    Spouse name: Not on file  . Number of children: Not on file  . Years of education: Not on file  . Highest education level: Not on file  Social Needs  . Financial resource strain: Not on file  . Food insecurity - worry: Not on file  . Food insecurity - inability: Not on file  . Transportation needs - medical: Not on file  . Transportation needs - non-medical: Not on file  Occupational History  . Not on file  Tobacco Use  . Smoking status: Former Smoker    Packs/day: 1.00    Years: 15.00    Pack years: 15.00    Types: Cigarettes    Last attempt to quit: 07/06/1967    Years since quitting: 50.4  . Smokeless tobacco: Never Used  . Tobacco comment: Quit over 38 years ago  Substance and Sexual Activity  . Alcohol use: No    Alcohol/week: 0.0 oz    Comment: Occasional use in the past.  . Drug use: No  . Sexual activity: Not on file  Other Topics Concern  . Not on file  Social History Narrative  . Not on file     Family History  Problem Relation Age of Onset  . Heart attack Father   . Hypertension Father   . Stroke Mother   . Cancer Neg Hx        Kidney, Bladder, Prostate     Review of Systems Positive for syncope Negative for: General:   chills, fever, night sweats or weight changes.  Cardiovascular: PND orthopnea is noted for syncope dizziness  Dermatological skin lesions rashes Respiratory: Cough congestion Urologic: Frequent urination urination at night and hematuria Abdominal: negative for nausea, vomiting, diarrhea, bright red blood per rectum, melena, or hematemesis Neurologic: negative for visual changes, and/or hearing changes  All other systems reviewed and are otherwise negative except as noted above.  Labs: Recent Labs    12/04/17 2115 12/05/17 0445 12/05/17 0538  TROPONINI <0.03 <0.03 0.04*   Lab Results  Component Value Date   WBC 8.9 12/05/2017   HGB  14.3 12/05/2017   HCT 41.8 12/05/2017   MCV 91.0 12/05/2017   PLT 206 12/05/2017    Recent Labs  Lab 12/04/17 2115 12/05/17 0445  NA 126* 127*  K 5.1 4.7  CL 94* 96*  CO2 23 19*  BUN 20 22*  CREATININE 1.47* 1.58*  CALCIUM 10.4* 10.3  PROT 8.0  --   BILITOT 0.7  --   ALKPHOS 76  --   ALT 27  --   AST 44*  --   GLUCOSE 100* 136*   Lab Results  Component Value Date   CHOL 256 (H) 09/18/2015   HDL 33 (L) 09/18/2015   LDLCALC 165 (H) 09/18/2015   TRIG 290 (H) 09/18/2015   No results found for: DDIMER  Radiology/Studies:  Dg Chest Port 1 View  Result Date: 12/04/2017 CLINICAL DATA:  Dizziness.  Bradycardia. EXAM: PORTABLE CHEST 1 VIEW COMPARISON:  May 25, 2017 FINDINGS: The patient has chronic loculated left-sided pleural effusion is stable. The heart, hila, and mediastinum are unchanged. No pneumothorax. A transcutaneous pacer overlies left side of the heart. A pacemaker is identified. No other acute abnormalities. IMPRESSION: 1. Chronic loculated left-sided pleural effusion. 2. No other acute abnormalities. Electronically Signed   By: Gerome Sam III M.D   On: 12/04/2017 22:00    EKG: Normal sinus rhythm complete heart block and ventricular escape  Weights: Filed Weights   12/04/17 2116 12/05/17 0000  Weight: 160 lb (72.6  kg) 165 lb 12.6 oz (75.2 kg)     Physical Exam: Blood pressure (!) 158/56, pulse (!) 34, temperature 97.6 F (36.4 C), temperature source Axillary, resp. rate 20, height 5\' 8"  (1.727 m), weight 165 lb 12.6 oz (75.2 kg), SpO2 100 %. Body mass index is 25.21 kg/m. General: Well developed, well nourished, in no acute distress. Head eyes ears nose throat: Normocephalic, atraumatic, sclera non-icteric, no xanthomas, nares are without discharge. No apparent thyromegaly and/or mass  Lungs: Normal respiratory effort.  no wheezes, no rales, no rhonchi.  Heart: RRR with normal S1 S2.  3+ right upper sternal border murmur gallop, no rub, PMI is normal size and placement, carotid upstroke normal without bruit, jugular venous pressure is normal Abdomen: Soft, non-tender, non-distended with normoactive bowel sounds. No hepatomegaly. No rebound/guarding. No obvious abdominal masses. Abdominal aorta is normal size without bruit Extremities: No edema. no cyanosis, no clubbing, no ulcers  Peripheral : 2+ bilateral upper extremity pulses, 2+ bilateral femoral pulses, 2+ bilateral dorsal pedal pulse Neuro: Alert and oriented. No facial asymmetry. No focal deficit. Moves all extremities spontaneously. Musculoskeletal: Normal muscle tone without kyphosis Psych:  Responds to questions appropriately with a normal affect.    Assessment: 84 year old male with coronary artery bypass surgery valvular heart disease with previous history of hypertension hyperlipidemia and complete heart block with syncope likely secondary to third-degree heart block with ventricular escape hemodynamically stable needing pacemaker battery change  Plan: 1.  Continue to abstain from medication management for hypertension control due to concerns of hypotension during heart block 2.  Proceed to battery change out of dual-chamber pacemaker due to loss of battery life and no evidence of reasonable escape rhythm.  Patient understands risk and  benefits of battery change.  This includes possibly infection bleeding blood clot.  Patient is at low risk for conscious sedation  Signed, Lamar Blinks M.D. [facility] Cardiology 12/05/2017, 8:16 AM

## 2017-12-05 NOTE — Progress Notes (Signed)
Patient with complete heart block, pacemaker at end of life, requires change-out.

## 2017-12-05 NOTE — Transfer of Care (Signed)
Immediate Anesthesia Transfer of Care Note  Patient: Glenn [name]  Procedure(s) Performed: PACEMAKER GENERATOR CHANGE (Left Chest)  Patient Location: PACU  Anesthesia Type:MAC  Level of Consciousness: awake and alert   Airway & Oxygen Therapy: Patient Spontanous Breathing and Patient connected to nasal cannula oxygen  Post-op Assessment: Report given to RN and Post -op Vital signs reviewed and stable  Post vital signs: Reviewed and stable  Last Vitals:  Vitals:   12/05/17 1435 12/05/17 1603  BP: (!) 141/54 (!) 106/54  Pulse: (!) 34 66  Resp: (!) 22 18  Temp:  36.7 C  SpO2: 100%     Last Pain:  Vitals:   12/05/17 1200  TempSrc: Axillary         Complications: No apparent anesthesia complications

## 2017-12-05 NOTE — Progress Notes (Signed)
Patient remains bradycardic HR 35 Dopamine infusing at 12 mcg/kg/min. Patient bladder scanned d/t low urine output >500 mL noted. In and Out cath done 300 mL out. Daughters and granddaughter remains at bedside. Patient without complaints states he feels fine when asked.

## 2017-12-05 NOTE — Anesthesia Post-op Follow-up Note (Signed)
Anesthesia QCDR form completed.        

## 2017-12-05 NOTE — Progress Notes (Addendum)
[employer] [facility] [place] [facility]   PATIENT NAME: Glenn [name]    MR#:  [id number]  DATE OF BIRTH:  November 13, 1933  SUBJECTIVE:   Daughter is at bedside. Patient is lethargic this morning  REVIEW OF SYSTEMS:    Unable to obtain this morning. Patient is lethargic  Tolerating Diet: npo      DRUG ALLERGIES:  No Known Allergies  VITALS:  Blood pressure (!) 142/51, pulse (!) 34, temperature 98.1 F (36.7 C), temperature source Oral, resp. rate (!) 22, height 5\' 8"  (1.727 m), weight 75.2 kg (165 lb 12.6 oz), SpO2 100 %.  PHYSICAL EXAMINATION:  Constitutional: Appears well-developed and well-nourished. No distress. HENT: Normocephalic.  Oropharynx is clear and moist.  Eyes: Conjunctivae  are normal. PERRLA, no scleral icterus.  Neck: Normal ROM. Neck supple. No JVD. No tracheal deviation. CVS: Bradycardic patient is pacemaker, no murmurs, no gallops, no carotid bruit.  Pulmonary: Effort and breath sounds normal, no stridor, rhonchi, wheezes, rales.  Abdominal: Soft. BS +,  no distension, tenderness, rebound or guarding.  Musculoskeletal: Normal range of motion. No edema and no tenderness.  Neuro: Lethargic. No obvious focal deficits. Skin: Skin is warm and dry. No rash noted. Psychiatric: Normal mood and affect.      LABORATORY PANEL:   CBC Recent Labs  Lab 12/05/17 0445  WBC 8.9  HGB 14.3  HCT 41.8  PLT 206   ------------------------------------------------------------------------------------------------------------------  Chemistries  Recent Labs  Lab 12/04/17 2115 12/05/17 0445  NA 126* 127*  K 5.1 4.7  CL 94* 96*  CO2 23 19*  GLUCOSE 100* 136*  BUN 20 22*  CREATININE 1.47* 1.58*  CALCIUM 10.4* 10.3  MG  --  2.0  AST 44*  --   ALT 27  --   ALKPHOS 76  --   BILITOT 0.7  --    ------------------------------------------------------------------------------------------------------------------  Cardiac Enzymes Recent Labs  Lab  12/04/17 2115 12/05/17 0445 12/05/17 0538  TROPONINI <0.03 <0.03 0.04*   ------------------------------------------------------------------------------------------------------------------  RADIOLOGY:  Dg Chest Port 1 View  Result Date: 12/04/2017 CLINICAL DATA:  Dizziness.  Bradycardia. EXAM: PORTABLE CHEST 1 VIEW COMPARISON:  May 25, 2017 FINDINGS: The patient has chronic loculated left-sided pleural effusion is stable. The heart, hila, and mediastinum are unchanged. No pneumothorax. A transcutaneous pacer overlies left side of the heart. A pacemaker is identified. No other acute abnormalities. IMPRESSION: 1. Chronic loculated left-sided pleural effusion. 2. No other acute abnormalities. Electronically Signed   By: Gerome Sam III M.D   On: 12/04/2017 22:00     ASSESSMENT AND PLAN:    84 year old male with history of pacemaker placement, CAD status post CABG and hypertension who presents due to dizziness and found to have bradycardia.  1. Severe persistent Symptomatic bradycardia with pacemaker malfunction/complete heart block Continue ICU status Cardiology consultation appreciated Abstain from beta blockers or calcium channel blockers Proceed to battery change out of dual-chamber pacemaker due to lost to battery life  2. Hypotension: Blood pressure has improved  3. CAD status post CABG Continue aspirin 4. Chronic kidney disease stage II: Creatinine is at baseline  5. Hyponatremia: Repeat sodium level in a.m. Continue normal saline  6. Hypothyroid: Continue Synthroid and check TSH  Management plans discussed with the patient's daughter and she is in agreement.  CODE STATUS: FULL  Critical care TOTAL TIME TAKING CARE OF THIS PATIENT: 38 minutes.   Patient at high risk for cardiopulmonary respiratory failure due to persistent bradycardia.  POSSIBLE D/C 1-2 days, DEPENDING  ON CLINICAL CONDITION.   Keelee Yankey M.D on 12/05/2017 at 11:12 AM  Between 7am to 6pm -  Pager - [phone] After 6pm go to [email or web address] - password [email or web address]  [facility]  Office  [phone]  CC: Primary care physician; Raynelle Bring  Note: This dictation was prepared with Dragon dictation along with smaller phrase technology. Any transcriptional errors that result from this process are unintentional.

## 2017-12-05 NOTE — Progress Notes (Signed)
Patient to OR for replacement of pacer battery, writer accompanied, left in PACU with RN Eve, gave her report, HR 38, 3rd degree HB.  daughter with him to sign consent.

## 2017-12-05 NOTE — Progress Notes (Signed)
eLink Physician-Brief Progress Note Patient Name: Glenn [name] DOB: 1934/03/10 MRN: [id number]   Date of Service  12/05/2017  HPI/Events of Note  84 yo male presents with dizziness. Found to have Complete Heart Block with HR = 25. BP = 108/74. Spoke to [facility] APP who is starting a Dopamine IV infusion, hooking patient up to transcutaneous pacer and consulting cardiology.   eICU Interventions  No new orders.      Intervention Category Evaluation Type: New Patient Evaluation  Lenell Antu 12/05/2017, 12:19 AM

## 2017-12-05 NOTE — Op Note (Signed)
[facility] Cardiology   12/05/2017                     3:59 PM  PATIENT:  Glenn [name]    PRE-OPERATIVE DIAGNOSIS:  Complete Heart Block  POST-OPERATIVE DIAGNOSIS:  Same  PROCEDURE:  PACEMAKER GENERATOR CHANGE  SURGEON:  Marcina Millard, MD    ANESTHESIA:     PREOPERATIVE INDICATIONS:  [name] is a  84 y.o. male with a diagnosis of Complete Heart Block who failed conservative measures and elected for surgical management.    The risks benefits and alternatives were discussed with the patient preoperatively including but not limited to the risks of infection, bleeding, cardiopulmonary complications, the need for revision surgery, among others, and the patient was willing to proceed.   OPERATIVE PROCEDURE: The patient was brought to the operating room fasting state.  The left pectoral region was prepped and draped in usual sterile manner.  Anesthesia was obtained 1% lidocaine locally.  A 6 cm incision was performed the left pectoral region.  The old pacemaker generator was retrieved by electrocautery and blunt dissection.  The leads were disconnected and interrogated revealing appropriate sensing and pacing thresholds.  The leads were connected to a new MRI compatible dual-chamber rate responsive pacemaker generator ( Medtronic [id number] ).  Pacemaker pocket was irrigated gentamicin solution.  Pacemaker generator was positioned into the pocket and the pocket was closed with 2-0 and 4-0 Vicryl, respectively.  Steri-Strips and a pressure dressing were applied.  There were no periprocedural complications.

## 2017-12-05 NOTE — Anesthesia Postprocedure Evaluation (Signed)
Anesthesia Post Note  Patient: Glenn [name]  Procedure(s) Performed: PACEMAKER GENERATOR CHANGE (Left Chest)  Patient location during evaluation: PACU Anesthesia Type: MAC Level of consciousness: awake and alert Pain management: pain level controlled Vital Signs Assessment: post-procedure vital signs reviewed and stable Respiratory status: spontaneous breathing, nonlabored ventilation, respiratory function stable and patient connected to nasal cannula oxygen Cardiovascular status: stable and blood pressure returned to baseline Postop Assessment: no apparent nausea or vomiting Anesthetic complications: no     Last Vitals:  Vitals:   12/05/17 1623 12/05/17 1633  BP: (!) 93/52 (!) 98/54  Pulse: 61 61  Resp: 19 17  Temp:  36.7 C  SpO2: 100% 100%    Last Pain:  Vitals:   12/05/17 1200  TempSrc: Axillary                 Annalis Kaczmarczyk S

## 2017-12-05 NOTE — Consult Note (Signed)
PULMONARY / CRITICAL CARE MEDICINE   Name: Glenn [name] MRN: [id number] DOB: 02/05/1934    ADMISSION DATE:  12/04/2017   CONSULTATION DATE:  12/05/2016  REFERRING MD:  Dr. Caryn Bee REASON: Sinus bradycardia due to pacemaker failure  HISTORY OF PRESENT ILLNESS:   This is an 84 y/o male with a h/o sick sinus syndrome s/p pacemaker placement 2008, CAD s/p CABG x 3, mitral value replacement, aortic value replacement, and hypertension who presented to the ED following a syncopal episode at home. When EMS arrived, his HR was in the teens to 30s/min.  PAST MEDICAL HISTORY :  He  has a past medical history of CAD (coronary artery disease), Constipation, Elevated PSA, Enlarged prostate, H/O aortic valve replacement, H/O mitral valve replacement, High cholesterol, Hypertension, Sick sinus syndrome (HCC), and Thyroid disease.  PAST SURGICAL HISTORY: He  has a past surgical history that includes Coronary artery bypass graft; Pacemaker insertion (2008); AoV  replacement; Medial partial knee replacement (2011); TURP vaporization (2010); and Intraocular lens insertion (2007).  No Known Allergies  No current facility-administered medications on file prior to encounter.    Current Outpatient Medications on File Prior to Encounter  Medication Sig  . aspirin EC 325 MG tablet Take 325 mg by mouth at bedtime.   Marland Kitchen azelastine (ASTELIN) 0.1 % nasal spray Place into the nose.  . cetirizine (ZYRTEC) 10 MG tablet Take by mouth.  . cetirizine (ZYRTEC) 10 MG tablet TAKE 1 TABLET BY MOUTH ONCE DAILY.  . ferrous sulfate 325 (65 FE) MG tablet Take 325 mg by mouth daily with breakfast.  . fluticasone (FLONASE) 50 MCG/ACT nasal spray Place 2 sprays into both nostrils daily as needed for allergies.   . Inulin (FIBER CHOICE PO) Take 2 capsules by mouth daily.  Marland Kitchen levothyroxine (SYNTHROID, LEVOTHROID) 75 MCG tablet   . levothyroxine (SYNTHROID, LEVOTHROID) 88 MCG tablet Take 88 mcg by mouth daily.   Marland Kitchen lisinopril  (PRINIVIL,ZESTRIL) 5 MG tablet   . lovastatin (MEVACOR) 40 MG tablet Take by mouth.  . metoprolol succinate (TOPROL-XL) 50 MG 24 hr tablet Take 50 mg by mouth daily.   . Multiple Vitamin (MULTI-VITAMINS) TABS Take by mouth.  . tiotropium (SPIRIVA HANDIHALER) 18 MCG inhalation capsule INHALE 1 CAPSULE AS DIRECTED ONCE DAILY.  Marland Kitchen tiotropium (SPIRIVA) 18 MCG inhalation capsule Place into inhaler and inhale.    FAMILY HISTORY:  His indicated that the status of his mother is unknown. He indicated that the status of his father is unknown. He indicated that the status of his neg hx is unknown.   SOCIAL HISTORY: He  reports that he quit smoking about 50 years ago. His smoking use included cigarettes. He has a 15.00 pack-year smoking history. he has never used smokeless tobacco. He reports that he does not drink alcohol or use drugs.  REVIEW OF SYSTEMS:   Noncontributory  SUBJECTIVE:    VITAL SIGNS: BP 118/64 Comment: taken manually   Pulse (!) 28   Temp 97.6 F (36.4 C) (Axillary)   Resp 16   Ht 5\' 8"  (1.727 m)   Wt 160 lb (72.6 kg)   SpO2 100%   BMI 24.33 kg/m   HEMODYNAMICS:    VENTILATOR SETTINGS:    INTAKE / OUTPUT: No intake/output data recorded.  PHYSICAL EXAMINATION: Gen: WDWN in NAD HEENT: NCAT, sclerae white, oropharynx normal Neck: No LAN, no JVD noted Lungs: full BS, normal percussion note throughout, no adventitious sounds Cardiovascular: Brady (vent escape on tele), regular, + syst M  Abdomen: Soft, NT, +BS Ext: no C/C/E Neuro: PERRL, EOMI, motor/sensory grossly intact Skin: No lesions noted   LABS:  BMET Recent Labs  Lab 12/04/17 2115  NA 126*  K 5.1  CL 94*  CO2 23  BUN 20  CREATININE 1.47*  GLUCOSE 100*    Electrolytes Recent Labs  Lab 12/04/17 2115  CALCIUM 10.4*    CBC Recent Labs  Lab 12/04/17 2115  WBC 5.9  HGB 12.9*  HCT 37.7*  PLT 200    Coag's No results for input(s): APTT, INR in the last 168 hours.  Sepsis  Markers No results for input(s): LATICACIDVEN, PROCALCITON, O2SATVEN in the last 168 hours.  ABG No results for input(s): PHART, PCO2ART, PO2ART in the last 168 hours.  Liver Enzymes Recent Labs  Lab 12/04/17 2115  AST 44*  ALT 27  ALKPHOS 76  BILITOT 0.7  ALBUMIN 4.4    Cardiac Enzymes Recent Labs  Lab 12/04/17 2115  TROPONINI <0.03    Glucose Recent Labs  Lab 12/04/17 2353  GLUCAP 68    Imaging Dg Chest Port 1 View  Result Date: 12/04/2017 CLINICAL DATA:  Dizziness.  Bradycardia. EXAM: PORTABLE CHEST 1 VIEW COMPARISON:  May 25, 2017 FINDINGS: The patient has chronic loculated left-sided pleural effusion is stable. The heart, hila, and mediastinum are unchanged. No pneumothorax. A transcutaneous pacer overlies left side of the heart. A pacemaker is identified. No other acute abnormalities. IMPRESSION: 1. Chronic loculated left-sided pleural effusion. 2. No other acute abnormalities. Electronically Signed   By: Gerome Sam III M.D   On: 12/04/2017 22:00   CXR reviewed by me   ASSESSMENT / PLAN: Severe bradycardia due to PM failure (battery needs to be replaced) Chronic loculated L pleural effusion - does not require further investigation  PM battery to be replaced today DC to home when OK with Cardiology  Billy Fischer, MD PCCM service Mobile [phone] Pager [phone] 12/05/2017 1:14 PM

## 2017-12-05 NOTE — Anesthesia Preprocedure Evaluation (Signed)
Anesthesia Evaluation  Patient identified by MRN, date of birth, ID band Patient awake    Reviewed: Allergy & Precautions, NPO status , Patient's Chart, lab work & pertinent test results, reviewed documented beta blocker date and time   Airway Mallampati: II  TM Distance: >3 FB     Dental  (+) Chipped, Upper Dentures, Lower Dentures   Pulmonary asthma , COPD, former smoker,           Cardiovascular hypertension, Pt. on medications + CAD, + CABG and +CHF  + dysrhythmias + pacemaker      Neuro/Psych    GI/Hepatic   Endo/Other    Renal/GU Renal disease Bladder dysfunction      Musculoskeletal  (+) Arthritis ,   Abdominal   Peds  Hematology  (+) anemia ,   Anesthesia Other Findings Sss. AVR replaced.  Reproductive/Obstetrics                             Anesthesia Physical Anesthesia Plan  ASA: III  Anesthesia Plan: MAC   Post-op Pain Management:    Induction:   PONV Risk Score and Plan:   Airway Management Planned:   Additional Equipment:   Intra-op Plan:   Post-operative Plan:   Informed Consent: I have reviewed the patients History and Physical, chart, labs and discussed the procedure including the risks, benefits and alternatives for the proposed anesthesia with the patient or authorized representative who has indicated his/her understanding and acceptance.     Plan Discussed with: CRNA  Anesthesia Plan Comments:         Anesthesia Quick Evaluation

## 2017-12-06 DIAGNOSIS — Z950 Presence of cardiac pacemaker: Secondary | ICD-10-CM

## 2017-12-06 LAB — PHOSPHORUS: Phosphorus: 2.7 mg/dL (ref 2.5–4.6)

## 2017-12-06 LAB — BASIC METABOLIC PANEL
Anion gap: 8 (ref 5–15)
BUN: 28 mg/dL — ABNORMAL HIGH (ref 6–20)
CO2: 20 mmol/L — ABNORMAL LOW (ref 22–32)
Calcium: 8.9 mg/dL (ref 8.9–10.3)
Chloride: 98 mmol/L — ABNORMAL LOW (ref 101–111)
Creatinine, Ser: 1.46 mg/dL — ABNORMAL HIGH (ref 0.61–1.24)
GFR calc Af Amer: 50 mL/min — ABNORMAL LOW (ref >60)
GFR calc non Af Amer: 43 mL/min — ABNORMAL LOW (ref >60)
Glucose, Bld: 89 mg/dL (ref 65–99)
Potassium: 4.5 mmol/L (ref 3.5–5.1)
Sodium: 126 mmol/L — ABNORMAL LOW (ref 135–145)

## 2017-12-06 LAB — MAGNESIUM: Magnesium: 1.7 mg/dL (ref 1.7–2.4)

## 2017-12-06 LAB — CBC
HCT: 31.5 % — ABNORMAL LOW (ref 40.0–52.0)
Hemoglobin: 10.6 g/dL — ABNORMAL LOW (ref 13.0–18.0)
MCH: 31.1 pg (ref 26.0–34.0)
MCHC: 33.7 g/dL (ref 32.0–36.0)
MCV: 92.2 fL (ref 80.0–100.0)
Platelets: 139 10*3/uL — ABNORMAL LOW (ref 150–440)
RBC: 3.42 MIL/uL — ABNORMAL LOW (ref 4.40–5.90)
RDW: 13.1 % (ref 11.5–14.5)
WBC: 4.7 10*3/uL (ref 3.8–10.6)

## 2017-12-06 LAB — URINE CULTURE

## 2017-12-06 LAB — GLUCOSE, CAPILLARY: Glucose-Capillary: 107 mg/dL — ABNORMAL HIGH (ref 65–99)

## 2017-12-06 MED ORDER — MAGNESIUM SULFATE 2 GM/50ML IV SOLN
2.0000 g | Freq: Once | INTRAVENOUS | Status: AC
  Administered 2017-12-06: 2 g via INTRAVENOUS
  Filled 2017-12-06: qty 50

## 2017-12-06 NOTE — Progress Notes (Signed)
[facility] Cardiology [facility] Encounter Note  Patient: Glenn [name] / Admit Date: 12/04/2017 / Date of Encounter: 12/06/2017, 9:07 AM   Subjective: Patient has no further dizziness or his presyncope.  Patient has no complication of pacemaker battery change.  Telemetry showing atrial and ventricular pacing  Review of Systems: Positive for: None Negative for: Vision change, hearing change, syncope, dizziness, nausea, vomiting,diarrhea, bloody stool, stomach pain, cough, congestion, diaphoresis, urinary frequency, urinary pain,skin lesions, skin rashes Others previously listed  Objective: Telemetry: Atrial ventricular pacing Physical Exam: Blood pressure 132/60, pulse 64, temperature 97.6 F (36.4 C), temperature source Axillary, resp. rate 19, height 5\' 8"  (1.727 m), weight 175 lb 4.3 oz (79.5 kg), SpO2 100 %. Body mass index is 26.65 kg/m. General: Well developed, well nourished, in no acute distress. Head: Normocephalic, atraumatic, sclera non-icteric, no xanthomas, nares are without discharge. Neck: No apparent masses Lungs: Normal respirations with no wheezes, no rhonchi, no rales , no crackles   Heart: Regular rate and rhythm, normal S1 S2, no 2-3+ right upper sternal border murmur, no rub, no gallop, PMI is normal size and placement, carotid upstroke normal without bruit, jugular venous pressure normal Abdomen: Soft, non-tender, non-distended with normoactive bowel sounds. No hepatosplenomegaly. Abdominal aorta is normal size without bruit Extremities: Trace edema, no clubbing, no cyanosis, no ulcers,  Peripheral: 2+ radial, 2+ femoral, 2+ dorsal pedal pulses Neuro: Alert and oriented. Moves all extremities spontaneously. Psych:  Responds to questions appropriately with a normal affect.   Intake/Output Summary (Last 24 hours) at 12/06/2017 0907 Last data filed at 12/06/2017 0800 Gross per 24 hour  Intake 2342.49 ml  Output 385 ml  Net 1957.49 ml    Inpatient  Medications:  . aspirin EC  325 mg Oral QHS  . docusate sodium  100 mg Oral BID  . ferrous sulfate  325 mg Oral Q breakfast  . heparin  5,000 Units Subcutaneous Q8H  . levothyroxine  88 mcg Oral QAC breakfast  . loratadine  10 mg Oral Daily  . pravastatin  10 mg Oral q1800   Infusions:  . sodium chloride 75 mL/hr at 12/06/17 0800  . DOPamine Stopped (12/05/17 1533)    Labs: Recent Labs    12/05/17 0445 12/06/17 0430  NA 127* 126*  K 4.7 4.5  CL 96* 98*  CO2 19* 20*  GLUCOSE 136* 89  BUN 22* 28*  CREATININE 1.58* 1.46*  CALCIUM 10.3 8.9  MG 2.0 1.7  PHOS 2.8 2.7   Recent Labs    12/04/17 2115  AST 44*  ALT 27  ALKPHOS 76  BILITOT 0.7  PROT 8.0  ALBUMIN 4.4   Recent Labs    12/04/17 2115 12/05/17 0445 12/06/17 0430  WBC 5.9 8.9 4.7  NEUTROABS 4.7  --   --   HGB 12.9* 14.3 10.6*  HCT 37.7* 41.8 31.5*  MCV 90.9 91.0 92.2  PLT 200 206 139*   Recent Labs    12/05/17 0445 12/05/17 0538 12/05/17 1117 12/05/17 1737  TROPONINI <0.03 0.04* <0.03 <0.03   Invalid input(s): POCBNP No results for input(s): HGBA1C in the last 72 hours.   Weights: Filed Weights   12/04/17 2116 12/05/17 0000 12/06/17 0500  Weight: 160 lb (72.6 kg) 165 lb 12.6 oz (75.2 kg) 175 lb 4.3 oz (79.5 kg)     Radiology/Studies:  Dg Chest Port 1 View  Result Date: 12/04/2017 CLINICAL DATA:  Dizziness.  Bradycardia. EXAM: PORTABLE CHEST 1 VIEW COMPARISON:  May 25, 2017 FINDINGS: The patient  has chronic loculated left-sided pleural effusion is stable. The heart, hila, and mediastinum are unchanged. No pneumothorax. A transcutaneous pacer overlies left side of the heart. A pacemaker is identified. No other acute abnormalities. IMPRESSION: 1. Chronic loculated left-sided pleural effusion. 2. No other acute abnormalities. Electronically Signed   By: Gerome Sam III M.D   On: 12/04/2017 22:00     Assessment and Recommendation  84 y.o. male with known cardiovascular disease status post  coronary bypass graft and double valve replacement with essential hypertension mixed hyperlipidemia having syncope secondary to loss of battery and dual-chamber pacing and now improved after battery change without evidence of heart failure myocardial infarction or chest pain 1.  Continue supportive care status post battery change 2.  Reinstatement of high intensity cholesterol therapy 3.  Begin ambulation and follow for improvements of symptoms and possible reinstatement of home blood pressure medications as necessary 4.  Further cardiac diagnostics necessary at this time 5.  Okay for discharge to home with follow-up next week  Signed, Arnoldo Hooker M.D. FACC

## 2017-12-06 NOTE — Plan of Care (Signed)
Pt discharged to home, his daughter will take him home, IV site [facility], bleeding controlled, dressing over pacemaker site is clean and dry, no drainage.  Pt wearing brief in case of incontinent episode.  VPacing in 60s on monitor. 100% on room air, BP stable.  Instruction given to his daughter, understanding verbalized.  His belongings have been returned.

## 2017-12-06 NOTE — Discharge Summary (Signed)
[facility] - [place] at [facility]   PATIENT NAME: Glenn [name]    MR#:  [id number]  DATE OF BIRTH:  Feb 22, 1934  DATE OF ADMISSION:  12/04/2017 ADMITTING PHYSICIAN: Cammy Copa, MD  DATE OF DISCHARGE: 12/06/2017  PRIMARY CARE PHYSICIAN: [facility], Gavin Potters    ADMISSION DIAGNOSIS:  Symptomatic bradycardia [R00.1] Persistent severe sinus bradycardia [R00.1]  DISCHARGE DIAGNOSIS:  Active Problems:   Bradycardia   Persistent severe sinus bradycardia    SECONDARY DIAGNOSIS:   Past Medical History:  Diagnosis Date  . CAD (coronary artery disease)    a. 12/2006 CABG x 3: LIMA->LAD, VG->PDA, VG->OM.  Marland Kitchen Constipation   . Elevated PSA   . Enlarged prostate   . H/O aortic valve replacement    a. 12/2006 Tissue AVR @ time of CABG.  . H/O mitral valve replacement    a. 12/2006 MV repair @ time of CABG.  . High cholesterol   . Hypertension   . Pleural effusion, left 2009   Chronic  . Sick sinus syndrome (HCC)    a. 12/2006 s/p MDT dual chamber PPM.  . Thyroid disease     HOSPITAL COURSE:   84 year old male with history of pacemaker placement, CAD status post CABG and hypertension who presents due to dizziness and found to have bradycardia.  1. Severe persistent Symptomatic bradycardia with pacemaker malfunction/complete heart block Patient is admitted to ICU. He had cardiology evaluation. He presented with bradycardia secondary to loss of battery and dual chamber pacing. Battery was changed and his heart rate has improved. He will avoid beta blocker for now however. He will have follow-up with cardiology in 1 week.  2. Hypotension: Blood pressure has improved We have discontinued beta blocker for now however this can be restarted as outpatient once he follows up with cardiology. He will continue lisinopril 3. CAD status post CABG Continue statin, aspirin and lisinopril. 4. Chronic kidney disease stage II: Creatinine is at baseline  5.  Hyponatremia: Patient will need outpatient follow-up regarding his sodium level. It is advised that patient continue to take in fluids. It is likely that the hyponatremia is due to dehydration. His TSH was within normal limits.  6. Hypothyroid: Continue Synthroid.    DISCHARGE CONDITIONS AND DIET:   Stable for discharge on heart healthy diet  CONSULTS OBTAINED:  Treatment Team:  Lamar Blinks, MD  DRUG ALLERGIES:  No Known Allergies  DISCHARGE MEDICATIONS:   Allergies as of 12/06/2017   No Known Allergies     Medication List    STOP taking these medications   azelastine 0.1 % nasal spray Commonly known as:  ASTELIN   metoprolol succinate 50 MG 24 hr tablet Commonly known as:  TOPROL-XL     TAKE these medications   aspirin EC 325 MG tablet Take 325 mg by mouth at bedtime.   cetirizine 10 MG tablet Commonly known as:  ZYRTEC TAKE 1 TABLET BY MOUTH ONCE DAILY. What changed:  Another medication with the same name was removed. Continue taking this medication, and follow the directions you see here.   ferrous sulfate 325 (65 FE) MG tablet Take 325 mg by mouth daily with breakfast.   FIBER CHOICE PO Take 2 capsules by mouth daily.   fluticasone 50 MCG/ACT nasal spray Commonly known as:  FLONASE Place 2 sprays into both nostrils daily as needed for allergies.   levothyroxine 88 MCG tablet Commonly known as:  SYNTHROID, LEVOTHROID Take 88 mcg by mouth daily. What changed:  Another medication  with the same name was removed. Continue taking this medication, and follow the directions you see here.   lisinopril 5 MG tablet Commonly known as:  PRINIVIL,ZESTRIL   lovastatin 40 MG tablet Commonly known as:  MEVACOR Take by mouth.   MULTI-VITAMINS Tabs Take by mouth.   SPIRIVA HANDIHALER 18 MCG inhalation capsule Generic drug:  tiotropium INHALE 1 CAPSULE AS DIRECTED ONCE DAILY. What changed:  Another medication with the same name was removed. Continue taking  this medication, and follow the directions you see here.         Today   CHIEF COMPLAINT:   No acute events overnight. Telemetry shows atrial paced rhythm.   VITAL SIGNS:  Blood pressure 132/60, pulse 64, temperature 97.6 F (36.4 C), temperature source Axillary, resp. rate 19, height 5\' 8"  (1.727 m), weight 79.5 kg (175 lb 4.3 oz), SpO2 100 %.   REVIEW OF SYSTEMS:  Review of Systems  Constitutional: Negative.  Negative for chills, fever and malaise/fatigue.  HENT: Negative.  Negative for ear discharge, ear pain, hearing loss, nosebleeds and sore throat.   Eyes: Negative.  Negative for blurred vision and pain.  Respiratory: Negative.  Negative for cough, hemoptysis, shortness of breath and wheezing.   Cardiovascular: Negative.  Negative for chest pain, palpitations and leg swelling.  Gastrointestinal: Negative.  Negative for abdominal pain, blood in stool, diarrhea, nausea and vomiting.  Genitourinary: Negative.  Negative for dysuria.  Musculoskeletal: Negative.  Negative for back pain.  Skin: Negative.   Neurological: Negative for dizziness, tremors, speech change, focal weakness, seizures and headaches.  Endo/Heme/Allergies: Negative.  Does not bruise/bleed easily.  Psychiatric/Behavioral: Negative.  Negative for depression, hallucinations and suicidal ideas.     PHYSICAL EXAMINATION:  GENERAL:  84 y.o.-year-old patient lying in the bed with no acute distress.  NECK:  Supple, no jugular venous distention. No thyroid enlargement, no tenderness.  LUNGS: Normal breath sounds bilaterally, no wheezing, rales,rhonchi  No use of accessory muscles of respiration.  CARDIOVASCULAR: S1, S2 normal. No murmurs, rubs, or gallops.  ABDOMEN: Soft, non-tender, non-distended. Bowel sounds present. No organomegaly or mass.  EXTREMITIES: No pedal edema, cyanosis, or clubbing.  PSYCHIATRIC: The patient is alert and oriented x 3.  SKIN: No obvious rash, lesion, or ulcer.   DATA REVIEW:    CBC Recent Labs  Lab 12/06/17 0430  WBC 4.7  HGB 10.6*  HCT 31.5*  PLT 139*    Chemistries  Recent Labs  Lab 12/04/17 2115  12/06/17 0430  NA 126*   < > 126*  K 5.1   < > 4.5  CL 94*   < > 98*  CO2 23   < > 20*  GLUCOSE 100*   < > 89  BUN 20   < > 28*  CREATININE 1.47*   < > 1.46*  CALCIUM 10.4*   < > 8.9  MG  --    < > 1.7  AST 44*  --   --   ALT 27  --   --   ALKPHOS 76  --   --   BILITOT 0.7  --   --    < > = values in this interval not displayed.    Cardiac Enzymes Recent Labs  Lab 12/05/17 0538 12/05/17 1117 12/05/17 1737  TROPONINI 0.04* <0.03 <0.03    Microbiology Results  @[email or web address] @  RADIOLOGY:  Dg Chest Port 1 View  Result Date: 12/04/2017 CLINICAL DATA:  Dizziness.  Bradycardia. EXAM: PORTABLE CHEST 1 VIEW COMPARISON:  May 25, 2017 FINDINGS: The patient has chronic loculated left-sided pleural effusion is stable. The heart, hila, and mediastinum are unchanged. No pneumothorax. A transcutaneous pacer overlies left side of the heart. A pacemaker is identified. No other acute abnormalities. IMPRESSION: 1. Chronic loculated left-sided pleural effusion. 2. No other acute abnormalities. Electronically Signed   By: Gerome Sam III M.D   On: 12/04/2017 22:00      Allergies as of 12/06/2017   No Known Allergies     Medication List    STOP taking these medications   azelastine 0.1 % nasal spray Commonly known as:  ASTELIN   metoprolol succinate 50 MG 24 hr tablet Commonly known as:  TOPROL-XL     TAKE these medications   aspirin EC 325 MG tablet Take 325 mg by mouth at bedtime.   cetirizine 10 MG tablet Commonly known as:  ZYRTEC TAKE 1 TABLET BY MOUTH ONCE DAILY. What changed:  Another medication with the same name was removed. Continue taking this medication, and follow the directions you see here.   ferrous sulfate 325 (65 FE) MG tablet Take 325 mg by mouth daily with breakfast.   FIBER CHOICE PO Take 2 capsules by mouth  daily.   fluticasone 50 MCG/ACT nasal spray Commonly known as:  FLONASE Place 2 sprays into both nostrils daily as needed for allergies.   levothyroxine 88 MCG tablet Commonly known as:  SYNTHROID, LEVOTHROID Take 88 mcg by mouth daily. What changed:  Another medication with the same name was removed. Continue taking this medication, and follow the directions you see here.   lisinopril 5 MG tablet Commonly known as:  PRINIVIL,ZESTRIL   lovastatin 40 MG tablet Commonly known as:  MEVACOR Take by mouth.   MULTI-VITAMINS Tabs Take by mouth.   SPIRIVA HANDIHALER 18 MCG inhalation capsule Generic drug:  tiotropium INHALE 1 CAPSULE AS DIRECTED ONCE DAILY. What changed:  Another medication with the same name was removed. Continue taking this medication, and follow the directions you see here.          Management plans discussed with the patient and daughter and they are in agreement. Stable for discharge home  Patient should follow up with pcp  CODE STATUS:     Code Status Orders  (From admission, onward)        Start     Ordered   12/05/17 0000  Full code  Continuous     12/04/17 2359    Code Status History    Date Active Date Inactive Code Status Order ID Comments User Context   07/05/2014 20:49 07/06/2014 19:59 Full Code [id number]  Annett Gula Inpatient      TOTAL TIME TAKING CARE OF THIS PATIENT: 38 minutes.    Note: This dictation was prepared with Dragon dictation along with smaller phrase technology. Any transcriptional errors that result from this process are unintentional.  Cristan Hout M.D on 12/06/2017 at 9:36 AM  Between 7am to 6pm - Pager - [phone] After 6pm go to [email or web address] - Social research officer, government  [facility]  Office  [phone]  CC: Primary care physician; Raynelle Bring

## 2017-12-30 ENCOUNTER — Encounter: Payer: Self-pay | Admitting: Emergency Medicine

## 2017-12-30 ENCOUNTER — Other Ambulatory Visit: Payer: Self-pay

## 2017-12-30 ENCOUNTER — Emergency Department: Payer: Medicare Other

## 2017-12-30 ENCOUNTER — Emergency Department
Admission: EM | Admit: 2017-12-30 | Discharge: 2017-12-30 | Disposition: A | Discharge status: Home / Self Care | Payer: Medicare Other | Attending: Emergency Medicine | Admitting: Emergency Medicine

## 2017-12-30 DIAGNOSIS — I130 Hypertensive heart and chronic kidney disease with heart failure and stage 1 through stage 4 chronic kidney disease, or unspecified chronic kidney disease: Secondary | ICD-10-CM | POA: Insufficient documentation

## 2017-12-30 DIAGNOSIS — N183 Chronic kidney disease, stage 3 (moderate): Secondary | ICD-10-CM | POA: Insufficient documentation

## 2017-12-30 DIAGNOSIS — I509 Heart failure, unspecified: Secondary | ICD-10-CM | POA: Diagnosis not present

## 2017-12-30 DIAGNOSIS — I2510 Atherosclerotic heart disease of native coronary artery without angina pectoris: Secondary | ICD-10-CM | POA: Diagnosis not present

## 2017-12-30 DIAGNOSIS — R05 Cough: Secondary | ICD-10-CM | POA: Insufficient documentation

## 2017-12-30 DIAGNOSIS — R338 Other retention of urine: Secondary | ICD-10-CM | POA: Diagnosis present

## 2017-12-30 DIAGNOSIS — R0981 Nasal congestion: Secondary | ICD-10-CM | POA: Insufficient documentation

## 2017-12-30 DIAGNOSIS — Z87891 Personal history of nicotine dependence: Secondary | ICD-10-CM | POA: Insufficient documentation

## 2017-12-30 DIAGNOSIS — Z951 Presence of aortocoronary bypass graft: Secondary | ICD-10-CM | POA: Insufficient documentation

## 2017-12-30 DIAGNOSIS — Z950 Presence of cardiac pacemaker: Secondary | ICD-10-CM | POA: Insufficient documentation

## 2017-12-30 DIAGNOSIS — J449 Chronic obstructive pulmonary disease, unspecified: Secondary | ICD-10-CM | POA: Insufficient documentation

## 2017-12-30 DIAGNOSIS — Z7982 Long term (current) use of aspirin: Secondary | ICD-10-CM | POA: Insufficient documentation

## 2017-12-30 DIAGNOSIS — K5641 Fecal impaction: Secondary | ICD-10-CM | POA: Insufficient documentation

## 2017-12-30 DIAGNOSIS — K5900 Constipation, unspecified: Secondary | ICD-10-CM

## 2017-12-30 DIAGNOSIS — J45909 Unspecified asthma, uncomplicated: Secondary | ICD-10-CM | POA: Diagnosis not present

## 2017-12-30 LAB — URINALYSIS, COMPLETE (UACMP) WITH MICROSCOPIC
Bacteria, UA: NONE SEEN
Bilirubin Urine: NEGATIVE
Glucose, UA: NEGATIVE mg/dL
Hgb urine dipstick: NEGATIVE
Ketones, ur: NEGATIVE mg/dL
Leukocytes, UA: NEGATIVE
Nitrite: NEGATIVE
Protein, ur: NEGATIVE mg/dL
Specific Gravity, Urine: 1.011 (ref 1.005–1.030)
Squamous Epithelial / LPF: NONE SEEN
pH: 5 (ref 5.0–8.0)

## 2017-12-30 LAB — BASIC METABOLIC PANEL
Anion gap: 10 (ref 5–15)
BUN: 23 mg/dL — ABNORMAL HIGH (ref 6–20)
CO2: 23 mmol/L (ref 22–32)
Calcium: 10.3 mg/dL (ref 8.9–10.3)
Chloride: 99 mmol/L — ABNORMAL LOW (ref 101–111)
Creatinine, Ser: 1.4 mg/dL — ABNORMAL HIGH (ref 0.61–1.24)
GFR calc Af Amer: 53 mL/min — ABNORMAL LOW (ref >60)
GFR calc non Af Amer: 46 mL/min — ABNORMAL LOW (ref >60)
Glucose, Bld: 127 mg/dL — ABNORMAL HIGH (ref 65–99)
Potassium: 4.6 mmol/L (ref 3.5–5.1)
Sodium: 132 mmol/L — ABNORMAL LOW (ref 135–145)

## 2017-12-30 LAB — CBC
HCT: 35 % — ABNORMAL LOW (ref 40.0–52.0)
Hemoglobin: 12.2 g/dL — ABNORMAL LOW (ref 13.0–18.0)
MCH: 31.3 pg (ref 26.0–34.0)
MCHC: 34.8 g/dL (ref 32.0–36.0)
MCV: 90.1 fL (ref 80.0–100.0)
Platelets: 191 10*3/uL (ref 150–440)
RBC: 3.89 MIL/uL — ABNORMAL LOW (ref 4.40–5.90)
RDW: 13.4 % (ref 11.5–14.5)
WBC: 5.7 10*3/uL (ref 3.8–10.6)

## 2017-12-30 NOTE — ED Triage Notes (Signed)
Pt to ED via POV c/o urination retention. Pt states that last night pt started having trouble urinating. Pt denies similar issues in the past. Pt states that he did start on cough and cold medication yesterday. Pt currently taking coricidin. Bladder scan done by EDT measured > 999 mL in bladder. Pt appears uncomfortable at this time.

## 2017-12-30 NOTE — Discharge Instructions (Signed)
STOP taking Coricidin (cough medicine) this may have caused you to become severely constipated and also unable to urinate.  You were seen in the emergency department today for constipation.  We recommend that you use one or more of the following over-the-counter medications in the order described:   1)  Colace (or Dulcolax) 100 mg:  This is a stool softener, and you may take it once or twice a day as needed. 2)  Senna tablets:  This is a bowel stimulant that will help "push" out your stool. It is the next step to add after you have tried a stool softener. 3)  Miralax (powder):  This medication works by drawing additional fluid into your intestines and helps to flush out your stool.  Mix the powder with water or juice according to label instructions.  It may help if the Colace and Senna are not sufficient, but you must be sure to use the recommended amount of water or juice when you mix up the powder. Remember that narcotic pain medications are constipating, so avoid them or minimize their use.  Drink plenty of fluids.  Please return to the Emergency Department immediately if you develop new or worsening symptoms that concern you, such as (but not limited to) fever > 101 degrees, severe abdominal pain, or persistent vomiting.  You were seen in the Emergency Department (ED) for urinary retention.  We placed a Foley catheter to help your bladder drain.  Please read through the included information and follow up with your doctor as recommended in these papers; your doctor will see you in clinic and help you determine when it is time to have the catheter removed.  If you stop producing urine in the bag or if you develop other symptoms that concern you, such as fever, chills, persistent vomiting, or severe abdominal pain, please return immediately to the Emergency Department.

## 2017-12-30 NOTE — ED Provider Notes (Signed)
[facility] Emergency Department Provider Note   ____________________________________________   First MD Initiated Contact with Patient 12/30/17 1407     (approximate)  I have reviewed the triage vital signs and the nursing notes.   HISTORY  Chief Complaint Urinary Retention    HPI Glenn [name] is a 84 y.o. male comes for evaluation due to difficulty urinating this morning  Patient reports that for about a week he has had cough slight congestion.  Reports that he has had a slight runny nose occasional cough.  No trouble breathing no fevers.  No chest pain.  Patient reports that he took some cough medication last night.  This morning when he got up he felt like he could not urinate, he reports he has some trouble starting and stopping urination in the past, but today was worse and is unusual for him not to be able to urinate at all.  His belly was beginning to hurt and he had a severe feeling that he needed to void.  He also feels that he is "constipated".  No nausea or vomiting.  Continue to eat and drink well, but feels like his stomach was more full than usual.  I am seeing the patient after he had a Foley catheter inserted.  He reports his pain is improved now, he is resting comfortably but still has a slight feeling of "constipation".  Denies ongoing urge to urinate, reports the catheter is definitely helped.  Past Medical History:  Diagnosis Date  . CAD (coronary artery disease)    a. 12/2006 CABG x 3: LIMA->LAD, VG->PDA, VG->OM.  Marland Kitchen Constipation   . Elevated PSA   . Enlarged prostate   . H/O aortic valve replacement    a. 12/2006 Tissue AVR @ time of CABG.  . H/O mitral valve replacement    a. 12/2006 MV repair @ time of CABG.  . High cholesterol   . Hypertension   . Pleural effusion, left 2009   Chronic  . Sick sinus syndrome (HCC)    a. 12/2006 s/p MDT dual chamber PPM.  . Thyroid disease     Patient Active Problem List   Diagnosis Date  Noted  . Bradycardia 12/04/2017  . Persistent severe sinus bradycardia 12/04/2017  . History of elevated PSA 09/28/2015  . BPH with obstruction/lower urinary tract symptoms 03/30/2015  . CAFL (chronic airflow limitation) (HCC) 03/25/2015  . Aortic valve defect 07/05/2014  . Asthma with chronic obstructive pulmonary disease (COPD) (HCC) 07/05/2014  . CHF with unknown LVEF (HCC) 07/05/2014  . CAD (coronary artery disease) 07/05/2014  . HLD (hyperlipidemia) 07/05/2014  . Hypertensive urgency 07/05/2014  . Anemia, iron deficiency 07/05/2014  . Disorder of mitral valve 07/05/2014  . Arthritis, degenerative 07/05/2014  . Sick sinus syndrome (HCC) 07/05/2014  . CKD (chronic kidney disease) stage 3, GFR 30-59 ml/min (HCC) 07/05/2014  . Chronic kidney disease (CKD), stage III (moderate) (HCC) 07/05/2014  . CCF (congestive cardiac failure) (HCC) 07/05/2014  . Atherosclerosis of coronary artery 07/05/2014  . Essential (primary) hypertension 07/05/2014  . BPH (benign prostatic hypertrophy) 05/03/2014  . Benign prostatic hypertrophy without urinary obstruction 05/03/2014  . Pleural effusion, left 10/19/2007    Past Surgical History:  Procedure Laterality Date  . AoV  replacement    . CORONARY ARTERY BYPASS GRAFT    . INTRAOCULAR LENS INSERTION  2007  . MEDIAL PARTIAL KNEE REPLACEMENT  2011  . PACEMAKER INSERTION  2008  . TURP VAPORIZATION  2010  Prior to Admission medications   Medication Sig Start Date End Date Taking? Authorizing Provider  aspirin EC 325 MG tablet Take 325 mg by mouth at bedtime.     [provider]  cetirizine (ZYRTEC) 10 MG tablet TAKE 1 TABLET BY MOUTH ONCE DAILY. 06/11/16   [provider]  ferrous sulfate 325 (65 FE) MG tablet Take 325 mg by mouth daily with breakfast.    [provider]  fluticasone (FLONASE) 50 MCG/ACT nasal spray Place 2 sprays into both nostrils daily as needed for allergies.  05/10/14   [provider]    Inulin (FIBER CHOICE PO) Take 2 capsules by mouth daily.    [provider]  levothyroxine (SYNTHROID, LEVOTHROID) 88 MCG tablet Take 88 mcg by mouth daily.  07/02/14   [provider]  lisinopril (PRINIVIL,ZESTRIL) 5 MG tablet  03/03/15   [provider]  lovastatin (MEVACOR) 40 MG tablet Take by mouth. 09/22/15   [provider]  Multiple Vitamin (MULTI-VITAMINS) TABS Take by mouth.    [provider]  tiotropium (SPIRIVA HANDIHALER) 18 MCG inhalation capsule INHALE 1 CAPSULE AS DIRECTED ONCE DAILY. 08/10/16   [provider]    Allergies Patient has no known allergies.  Family History  Problem Relation Age of Onset  . Heart attack Father   . Hypertension Father   . Stroke Mother   . Cancer Neg Hx        Kidney, Bladder, Prostate    Social History Social History   Tobacco Use  . Smoking status: Former Smoker    Packs/day: 1.00    Years: 15.00    Pack years: 15.00    Types: Cigarettes    Last attempt to quit: 07/06/1967    Years since quitting: 50.5  . Smokeless tobacco: Never Used  . Tobacco comment: Quit over 38 years ago  Substance Use Topics  . Alcohol use: No    Alcohol/week: 0.0 oz    Comment: Occasional use in the past.  . Drug use: No    Review of Systems Constitutional: No fever/chills some slight fatigue Eyes: No visual changes. ENT: No sore throat.  Slight runny nose over the last week. Cardiovascular: Denies chest pain. Respiratory: Denies shortness of breath.  Occasional dry cough. Gastrointestinal: No abdominal pain at present though he had notable pain earlier when he could not urinate.  No nausea, no vomiting.  No diarrhea.   Genitourinary: Did not notice any trouble or problems urinating earlier, it seems worse today. Musculoskeletal: Negative for back pain. Skin: Negative for rash. Neurological: Negative for headaches, focal weakness or  numbness.    ____________________________________________   PHYSICAL EXAM:  VITAL SIGNS: ED Triage Vitals  Enc Vitals Group     BP 12/30/17 1134 (!) 168/77     Pulse Rate 12/30/17 1134 92     Resp --      Temp 12/30/17 1134 98.1 F (36.7 C)     Temp Source 12/30/17 1134 Oral     SpO2 12/30/17 1134 98 %     Weight 12/30/17 1134 160 lb (72.6 kg)     Height 12/30/17 1134  (1.702 m)     Head Circumference --      Peak Flow --      Pain Score 12/30/17 1139 6     Pain Loc --      Pain Edu? --      Excl. in GC? --     Constitutional: Alert and oriented.  Well appearing and in no acute distress.  He is very pleasant. Eyes: Conjunctivae are normal. Head: Atraumatic. Nose: No congestion/rhinnorhea. Mouth/Throat: Mucous membranes are moist. Neck: No stridor.   Cardiovascular: Normal rate, regular rhythm. Grossly normal heart sounds.  Good peripheral circulation.  Pacemaker site left upper chest wall. Respiratory: Normal respiratory effort.  No retractions. Lungs CTAB. Gastrointestinal: Soft and nontender. No distention.  No rebound or guarding.  Normal bowel sounds. Genitourinary: Foley catheter in place draining clear yellow urine.  Normal circumcised penis.  No testicular swelling or erythema.  Rectal exam demonstrates soft stool in the rectal vault, small external hemorrhoid which is slightly tender. Musculoskeletal: No lower extremity tenderness nor edema. Neurologic:  Normal speech and language. No gross focal neurologic deficits are appreciated.  Skin:  Skin is warm, dry and intact. No rash noted. Psychiatric: Mood and affect are normal. Speech and behavior are normal.  ____________________________________________   LABS (all labs ordered are listed, but only abnormal results are displayed)  Labs Reviewed  URINALYSIS, COMPLETE (UACMP) WITH MICROSCOPIC - Abnormal; Notable for the following components:      Result Value   Color, Urine YELLOW (*)    APPearance CLEAR  (*)    All other components within normal limits  BASIC METABOLIC PANEL - Abnormal; Notable for the following components:   Sodium 132 (*)    Chloride 99 (*)    Glucose, Bld 127 (*)    BUN 23 (*)    Creatinine, Ser 1.40 (*)    GFR calc non Af Amer 46 (*)    GFR calc Af Amer 53 (*)    All other components within normal limits  CBC - Abnormal; Notable for the following components:   RBC 3.89 (*)    Hemoglobin 12.2 (*)    HCT 35.0 (*)    All other components within normal limits   ____________________________________________  EKG   ____________________________________________  RADIOLOGY    X-ray abdomen reviewed, no obstruction.  Large volume of stool. ____________________________________________   PROCEDURES  Procedure(s) performed: None  Procedures  Critical Care performed: No  ____________________________________________   INITIAL IMPRESSION / ASSESSMENT AND PLAN / ED COURSE  Pertinent labs & imaging results that were available during my care of the patient were reviewed by me and considered in my medical decision making (see chart for details).  Patient presents for evaluation of urinary retention.  Recent upper respiratory symptoms, "cold" like symptoms.  He appears well no hypoxia no increased work of breathing.  Afebrile.  Lungs are clear speaks with clear sentences.  No indication for x-ray denoted of the chest.  Doubt pneumonia.  I do suspect however that he may have developed urinary retention due to use of Coricidin.  Discussed with the patient, placed a Foley catheter with good effect at this time.  No evidence of urinary tract infection denoted.  Patient reports improvement and his renal function reassuring.  Normal white blood cell count.  No ongoing abdominal pain.  He does feel "constipated" however.  Given the history, we will evaluate for severe constipation and feels far less likely risk for obstructive pathology.    Clinical Course as of Dec 31 1731  Fri Dec 30, 2017  1516 For manual disimpaction, patient does have a rather hard stool ball of the rectal vault.  Was able to extract a moderate amount of stool without any complication.  However, the patient certainly still has stool in the rectal vault I cannot reach.  At this point  we will also give a soapsuds enema.  [MQ]    Clinical Course User Index [MQ] Sharyn Creamer, MD    ----------------------------------------- 5:33 PM on 12/30/2017 -----------------------------------------  Patient passing gas, reports he had a small bowel movement.  Discomfort improved.  Resting comfortably no distress.  Discussed treatment recommendations for constipation, Foley catheter care and close follow-up and careful return precautions with patient and his family at the bedside.  Return precautions and treatment recommendations and follow-up discussed with the patient who is agreeable with the plan.  ____________________________________________   FINAL CLINICAL IMPRESSION(S) / ED DIAGNOSES  Final diagnoses:  Fecal impaction in rectum (HCC)  Acute urinary retention  Constipation, unspecified constipation type      NEW MEDICATIONS STARTED DURING THIS VISIT:  New Prescriptions   No medications on file     Note:  This document was prepared using Dragon voice recognition software and may include unintentional dictation errors.     Sharyn Creamer, MD 12/30/17 [phone]

## 2017-12-30 NOTE — ED Notes (Signed)
Patient transported to X-ray 

## 2018-01-09 ENCOUNTER — Ambulatory Visit (INDEPENDENT_AMBULATORY_CARE_PROVIDER_SITE_OTHER): Payer: Medicare Other

## 2018-01-09 VITALS — BP 127/60 | HR 68 | Ht 67.0 in | Wt 155.0 lb

## 2018-01-09 DIAGNOSIS — N401 Enlarged prostate with lower urinary tract symptoms: Secondary | ICD-10-CM

## 2018-01-09 DIAGNOSIS — N138 Other obstructive and reflux uropathy: Secondary | ICD-10-CM | POA: Diagnosis not present

## 2018-01-09 NOTE — Progress Notes (Signed)
Pt presents today for V&T. 25fr foley removed without difficulty. Reinforced with pt to drink 6-8/8oz glasses of water and RTC this afternoon by 3pm for bladder scan. Pt and daughter voiced understanding.   Blood pressure 127/60, pulse 68, height 5\' 7"  (1.702 m), weight 155 lb (70.3 kg).

## 2018-01-16 NOTE — Progress Notes (Signed)
3:24 PM   Glenn [name] 03/28/1934 [id number]  Referring provider: Raynelle Bring [address] [place], [place] [address]  Chief Complaint  Patient presents with  . Elevated PSA    HPI: Patient is an 84 year old Caucasian male with a history of elevated PSA, BPH and lower urinary tract symptoms and an episode of urinary retention associated with constipation who presents today for follow-up with his daughter, Boyd Kerbs and grandson Annice Pih.    He presented to the emergency room on December 30, 2016 with a complaint of inability to urinate.  He was found to have greater than 999 cc on bladder scan and was also noted to be impacted with stool.  Patient had a Foley catheter placed and was disimpacted in the emergency room.  He then presented to our office on January 09, 2018 for a voiding trial.  Per family, he is having hallucinations and "talking out of his head."  Patient denies any gross hematuria, dysuria or suprapubic/flank pain.  Patient denies any fevers, chills, nausea or vomiting.    His UA today is positive for 6-10 WBC's and many bacteria.    They state he has just completed Macrobid for seven days for a staphylococcus epidermidis UTI.     BPH WITH LUTS Patient is unable to fill out I PSS form due to his mental status  His PVR today is 110 mL.  His previous IPSS score was 17, which is moderate lower urinary tract symptomatology. He is pleased with his quality life due to his urinary symptoms.  His previous I PSS score was 3/2.  He denies any dysuria, hematuria or suprapubic pain.  His has had TUMT in 2010.  He also denies any recent fevers, chills, nausea or vomiting.  He does not have a family history of PCa.  PMH: Past Medical History:  Diagnosis Date  . CAD (coronary artery disease)    a. 12/2006 CABG x 3: LIMA->LAD, VG->PDA, VG->OM.  Marland Kitchen Constipation   . Elevated PSA   . Enlarged prostate   . H/O aortic valve replacement    a. 12/2006 Tissue AVR @ time of  CABG.  . H/O mitral valve replacement    a. 12/2006 MV repair @ time of CABG.  . High cholesterol   . Hypertension   . Pleural effusion, left 2009   Chronic  . Sick sinus syndrome (HCC)    a. 12/2006 s/p MDT dual chamber PPM.  . Thyroid disease     Surgical History: Past Surgical History:  Procedure Laterality Date  . AoV  replacement    . CORONARY ARTERY BYPASS GRAFT    . INTRAOCULAR LENS INSERTION  2007  . MEDIAL PARTIAL KNEE REPLACEMENT  2011  . PACEMAKER INSERTION  2008  . TURP VAPORIZATION  2010    Home Medications:  Allergies as of 01/17/2018   No Known Allergies     Medication List        Accurate as of 01/17/18  3:24 PM. Always use your most recent med list.          aspirin EC 325 MG tablet Take 325 mg by mouth at bedtime.   cetirizine 10 MG tablet Commonly known as:  ZYRTEC TAKE 1 TABLET BY MOUTH ONCE DAILY.   ferrous sulfate 325 (65 FE) MG tablet Take 325 mg by mouth daily with breakfast.   FIBER CHOICE PO Take 2 capsules by mouth daily.   fluticasone 50 MCG/ACT nasal spray Commonly known as:  FLONASE Place  2 sprays into both nostrils daily as needed for allergies.   levothyroxine 88 MCG tablet Commonly known as:  SYNTHROID, LEVOTHROID Take 88 mcg by mouth daily.   lisinopril 5 MG tablet Commonly known as:  PRINIVIL,ZESTRIL   lovastatin 40 MG tablet Commonly known as:  MEVACOR Take by mouth.   MULTI-VITAMINS Tabs Take by mouth.   nitrofurantoin (macrocrystal-monohydrate) 100 MG capsule Commonly known as:  MACROBID Take 1 capsule (100 mg total) by mouth every 12 (twelve) hours.   SPIRIVA HANDIHALER 18 MCG inhalation capsule Generic drug:  tiotropium INHALE 1 CAPSULE AS DIRECTED ONCE DAILY.       Allergies: No Known Allergies  Family History: Family History  Problem Relation Age of Onset  . Heart attack Father   . Hypertension Father   . Stroke Mother   . Cancer Neg Hx        Kidney, Bladder, Prostate    Social History:   reports that he quit smoking about 50 years ago. His smoking use included cigarettes. He has a 15.00 pack-year smoking history. He has never used smokeless tobacco. He reports that he does not drink alcohol or use drugs.  ROS: UROLOGY Frequent Urination?: No Hard to postpone urination?: No Burning/pain with urination?: No Get up at night to urinate?: No Leakage of urine?: No Urine stream starts and stops?: No Trouble starting stream?: No Do you have to strain to urinate?: No Blood in urine?: No Urinary tract infection?: No Sexually transmitted disease?: No Injury to kidneys or bladder?: No Painful intercourse?: No Weak stream?: No Erection problems?: No Penile pain?: No Gastrointestinal Nausea?: No Vomiting?: No Indigestion/heartburn?: No Diarrhea?: No Constipation?: No Constitutional Fever: No Night sweats?: No Weight loss?: No Fatigue?: No Skin Skin rash/lesions?: No Itching?: No Eyes Blurred vision?: No Double vision?: No Ears/Nose/Throat Sore throat?: No Sinus problems?: No Hematologic/Lymphatic Swollen glands?: No Easy bruising?: No Cardiovascular Leg swelling?: No Chest pain?: No Respiratory Cough?: No Shortness of breath?: No Endocrine Excessive thirst?: No Musculoskeletal Back pain?: No Joint pain?: No Neurological Headaches?: No Dizziness?: No Psychologic Depression?: No Anxiety?: No   Physical Exam: BP 131/85 (BP Location: Right Arm, Patient Position: Sitting, Cuff Size: Normal)   Pulse 61   Ht 5\' 7"  (1.702 m)   BMI 24.28 kg/m   Constitutional: Well nourished. Alert and oriented, No acute distress. HEENT: [place] AT, moist mucus membranes. Trachea midline, no masses. Cardiovascular: No clubbing, cyanosis, or edema. Respiratory: Normal respiratory effort, no increased work of breathing. Skin: No rashes, bruises or suspicious lesions. Lymph: No cervical or inguinal adenopathy. Neurologic: Grossly intact, no focal deficits, moving all 4  extremities. Psychiatric: Normal mood and affect.   Laboratory Data: Lab Results  Component Value Date   WBC 5.7 12/30/2017   HGB 12.2 (L) 12/30/2017   HCT 35.0 (L) 12/30/2017   MCV 90.1 12/30/2017   PLT 191 12/30/2017    Lab Results  Component Value Date   CREATININE 1.40 (H) 12/30/2017  PSA History:    1.4 ng/mL on 08/29/2012    1.7 ng/mL on 02/26/2013    1.6 ng/mL on 08/29/2013    1.3 ng/mL on 03/22/2014    1.2 ng/mL on 09/23/2014     3.7 ng/mL on 09/18/2015     5.18 ng/mL on 02/2017    Lab Results  Component Value Date   HGBA1C 5.9 (H) 07/05/2014    Assessment & Plan:   1. History of urinary retention Patient had retention associated with fecal impaction PVR is minimal at  this time  2. UTI Patient's family members state that they have noticed mental status changes and hallucinations UA is suspicious for infection Urine sent for culture Will start nitrofurantoin empirically - will adjust if appropriate once urine and sensitives are available   1. BPH with LU TS He is no longer taking finasteride or Avodart.     Return for pending urine culture.  Michiel Cowboy, PA-C  [facility] [address], [address] [place], [place] [address] [phone]

## 2018-01-17 ENCOUNTER — Other Ambulatory Visit: Payer: Self-pay

## 2018-01-17 ENCOUNTER — Encounter: Payer: Self-pay | Admitting: Urology

## 2018-01-17 ENCOUNTER — Ambulatory Visit (INDEPENDENT_AMBULATORY_CARE_PROVIDER_SITE_OTHER): Payer: Medicare Other | Admitting: Urology

## 2018-01-17 VITALS — BP 131/85 | HR 61 | Ht 67.0 in

## 2018-01-17 DIAGNOSIS — Z87898 Personal history of other specified conditions: Secondary | ICD-10-CM | POA: Diagnosis not present

## 2018-01-17 DIAGNOSIS — N401 Enlarged prostate with lower urinary tract symptoms: Secondary | ICD-10-CM

## 2018-01-17 DIAGNOSIS — N138 Other obstructive and reflux uropathy: Secondary | ICD-10-CM

## 2018-01-17 DIAGNOSIS — N3000 Acute cystitis without hematuria: Secondary | ICD-10-CM | POA: Diagnosis not present

## 2018-01-17 LAB — MICROSCOPIC EXAMINATION: Epithelial Cells (non renal): NONE SEEN /hpf (ref 0–10)

## 2018-01-17 LAB — URINALYSIS, COMPLETE
Bilirubin, UA: NEGATIVE
Glucose, UA: NEGATIVE
Nitrite, UA: NEGATIVE
RBC, UA: NEGATIVE
Specific Gravity, UA: 1.025 (ref 1.005–1.030)
Urobilinogen, Ur: 1 mg/dL (ref 0.2–1.0)
pH, UA: 6.5 (ref 5.0–7.5)

## 2018-01-17 LAB — BLADDER SCAN AMB NON-IMAGING: Scan Result: 110

## 2018-01-17 MED ORDER — NITROFURANTOIN MONOHYD MACRO 100 MG PO CAPS: 100.0000 mg | ORAL_CAPSULE | Freq: Two times a day (BID) | ORAL | 0 refills | Status: DC

## 2018-01-19 ENCOUNTER — Telehealth: Payer: Self-pay

## 2018-01-19 LAB — CULTURE, URINE COMPREHENSIVE

## 2018-01-19 NOTE — Telephone Encounter (Signed)
Getting up at night to urinate is not a sign of infection.  It is a sign of sleep apnea.  We need to wait on the culture as he has had an infection recently.

## 2018-01-19 NOTE — Telephone Encounter (Signed)
Pt daughter called stating the same abx was sent in that pt was taking from PCP. Daughter stated that macrobid is not working for the pt. Daughter stated that pt was up every 5 minutes last night during the night and fell 3x. Reinforced with daughter will need ucx results. Daughter requested something today. Please advise.

## 2018-01-20 NOTE — Telephone Encounter (Signed)
Spoke with pt daughter about ucx results and macrobid. Daughter stated that PCP gave pt cipro and now pt is doing much better. Reinforced with daughter ucx was negative. Daughter stated "Well that's strange. Once he started the cipro he started getting much better. Im going to keep him on the cipro.".

## 2018-01-20 NOTE — Telephone Encounter (Signed)
-----   Message from Harle Battiest, PA-C sent at 01/20/2018  7:59 AM EDT ----- Please let Mr. Dearman's family know that his urine culture is negative.  He needs to stop the nitrofurantoin and see his PCP.

## 2018-11-01 ENCOUNTER — Other Ambulatory Visit: Payer: Self-pay | Admitting: Specialist

## 2018-11-01 DIAGNOSIS — J90 Pleural effusion, not elsewhere classified: Secondary | ICD-10-CM

## 2018-11-03 ENCOUNTER — Ambulatory Visit
Admission: RE | Admit: 2018-11-03 | Discharge: 2018-11-03 | Disposition: A | Discharge status: Home / Self Care | Payer: Medicare Other | Source: Ambulatory Visit | Attending: Interventional Radiology | Admitting: Interventional Radiology

## 2018-11-03 ENCOUNTER — Ambulatory Visit
Admission: RE | Admit: 2018-11-03 | Discharge: 2018-11-03 | Disposition: A | Discharge status: Home / Self Care | Payer: Medicare Other | Source: Ambulatory Visit | Attending: Specialist | Admitting: Specialist

## 2018-11-03 DIAGNOSIS — J9811 Atelectasis: Secondary | ICD-10-CM | POA: Insufficient documentation

## 2018-11-03 DIAGNOSIS — J90 Pleural effusion, not elsewhere classified: Secondary | ICD-10-CM | POA: Insufficient documentation

## 2018-11-03 LAB — GLUCOSE, PLEURAL OR PERITONEAL FLUID: Glucose, Fluid: 36 mg/dL

## 2018-11-03 LAB — PROTEIN, PLEURAL OR PERITONEAL FLUID: Total protein, fluid: 4.5 g/dL

## 2018-11-03 LAB — BODY FLUID CELL COUNT WITH DIFFERENTIAL
Eos, Fluid: 0 %
Lymphs, Fluid: 0 %
Monocyte-Macrophage-Serous Fluid: 0 %
Neutrophil Count, Fluid: 0 %
Total Nucleated Cell Count, Fluid: 0 cu mm

## 2018-11-03 LAB — LACTATE DEHYDROGENASE, PLEURAL OR PERITONEAL FLUID: LD, Fluid: 1828 U/L — ABNORMAL HIGH (ref 3–23)

## 2018-11-03 NOTE — Procedures (Signed)
Chronic left pleural eff  S/p [place] dx thora  160cc dark thin pleural fld asp Sample sent to lab No comp Stable ebl min Full report in pacs

## 2018-11-07 LAB — CYTOLOGY - NON PAP

## 2018-11-07 LAB — BODY FLUID CULTURE: Culture: NO GROWTH

## 2018-11-08 LAB — ACID FAST SMEAR (AFB, MYCOBACTERIA): Acid Fast Smear: NEGATIVE

## 2018-11-27 ENCOUNTER — Other Ambulatory Visit (INDEPENDENT_AMBULATORY_CARE_PROVIDER_SITE_OTHER): Payer: Self-pay | Admitting: Vascular Surgery

## 2018-11-27 DIAGNOSIS — I739 Peripheral vascular disease, unspecified: Secondary | ICD-10-CM

## 2018-11-28 ENCOUNTER — Other Ambulatory Visit: Payer: Self-pay

## 2018-11-28 ENCOUNTER — Encounter (INDEPENDENT_AMBULATORY_CARE_PROVIDER_SITE_OTHER): Payer: Self-pay | Admitting: Vascular Surgery

## 2018-11-28 ENCOUNTER — Ambulatory Visit (INDEPENDENT_AMBULATORY_CARE_PROVIDER_SITE_OTHER): Payer: Medicare Other | Admitting: Vascular Surgery

## 2018-11-28 ENCOUNTER — Ambulatory Visit (INDEPENDENT_AMBULATORY_CARE_PROVIDER_SITE_OTHER): Payer: Medicare Other

## 2018-11-28 VITALS — BP 156/92 | HR 69 | Resp 12 | Ht 63.0 in | Wt 159.0 lb

## 2018-11-28 DIAGNOSIS — M79605 Pain in left leg: Secondary | ICD-10-CM

## 2018-11-28 DIAGNOSIS — E785 Hyperlipidemia, unspecified: Secondary | ICD-10-CM | POA: Diagnosis not present

## 2018-11-28 DIAGNOSIS — Z87891 Personal history of nicotine dependence: Secondary | ICD-10-CM

## 2018-11-28 DIAGNOSIS — I10 Essential (primary) hypertension: Secondary | ICD-10-CM

## 2018-11-28 DIAGNOSIS — M79604 Pain in right leg: Secondary | ICD-10-CM | POA: Diagnosis not present

## 2018-11-28 DIAGNOSIS — I739 Peripheral vascular disease, unspecified: Secondary | ICD-10-CM | POA: Diagnosis not present

## 2018-11-28 DIAGNOSIS — M79609 Pain in unspecified limb: Secondary | ICD-10-CM | POA: Insufficient documentation

## 2018-11-28 NOTE — Progress Notes (Signed)
Patient ID: Glenn [name], male   DOB: March 31, 1934, 85 y.o.   MRN: [id number]  Chief Complaint  Patient presents with  . Follow-up    HPI Glenn [name] is a 85 y.o. male.  I am asked to see the patient by Dr. Darrold Junker for evaluation of claudication symptoms.  The patient reports a fair bit of instability on his feet and frequent falls.  He reports no open wounds or infection.  No clear inciting event or causative factor that started the symptoms.  This has been going on and steadily progressing over months to years.  Both legs are affected.  He says he has been told it might be arthritis and some other things but his cardiologist was concerned about his perfusion.  He had nondiagnostic ABIs due to noncompressible vessels on a previous baseline study.  We performed noninvasive studies today.  Although his vessels were noncompressible from medial calcification, he had normal brisk waveforms bilaterally and normal digital pressures and waveforms bilaterally consistent with some calcific peripheral arterial disease without flow limitation currently.   Past Medical History:  Diagnosis Date  . CAD (coronary artery disease)    a. 12/2006 CABG x 3: LIMA->LAD, VG->PDA, VG->OM.  Marland Kitchen Constipation   . Elevated PSA   . Enlarged prostate   . H/O aortic valve replacement    a. 12/2006 Tissue AVR @ time of CABG.  . H/O mitral valve replacement    a. 12/2006 MV repair @ time of CABG.  . High cholesterol   . Hypertension   . Pleural effusion, left 2009   Chronic  . Sick sinus syndrome (HCC)    a. 12/2006 s/p MDT dual chamber PPM.  . Thyroid disease     Past Surgical History:  Procedure Laterality Date  . AoV  replacement    . CORONARY ARTERY BYPASS GRAFT    . INTRAOCULAR LENS INSERTION  2007  . MEDIAL PARTIAL KNEE REPLACEMENT  2011  . PACEMAKER INSERTION  2008  . TURP VAPORIZATION  2010    Family History  Problem Relation Age of Onset  . Heart attack Father   . Hypertension Father     . Stroke Mother   . Cancer Neg Hx        Kidney, Bladder, Prostate     Social History Social History   Tobacco Use  . Smoking status: Former Smoker    Packs/day: 1.00    Years: 15.00    Pack years: 15.00    Types: Cigarettes    Last attempt to quit: 07/06/1967    Years since quitting: 51.4  . Smokeless tobacco: Never Used  . Tobacco comment: Quit over 38 years ago  Substance Use Topics  . Alcohol use: No    Alcohol/week: 0.0 standard drinks    Comment: Occasional use in the past.  . Drug use: No     No Known Allergies  Current Outpatient Medications  Medication Sig Dispense Refill  . aspirin EC 325 MG tablet Take 325 mg by mouth at bedtime.     . cetirizine (ZYRTEC) 10 MG tablet TAKE 1 TABLET BY MOUTH ONCE DAILY.    . ferrous sulfate 325 (65 FE) MG tablet Take 325 mg by mouth daily with breakfast.    . fluticasone (FLONASE) 50 MCG/ACT nasal spray Place 2 sprays into both nostrils daily as needed for allergies.     Marland Kitchen levothyroxine (SYNTHROID, LEVOTHROID) 88 MCG tablet Take 88 mcg by mouth daily.     Marland Kitchen  lisinopril (PRINIVIL,ZESTRIL) 5 MG tablet     . lovastatin (MEVACOR) 40 MG tablet Take by mouth.    . Multiple Vitamin (MULTI-VITAMINS) TABS Take by mouth.    . Inulin (FIBER CHOICE PO) Take 2 capsules by mouth daily.    . nitrofurantoin, macrocrystal-monohydrate, (MACROBID) 100 MG capsule Take 1 capsule (100 mg total) by mouth every 12 (twelve) hours. (Patient not taking: Reported on 11/28/2018) 14 capsule 0  . tiotropium (SPIRIVA HANDIHALER) 18 MCG inhalation capsule INHALE 1 CAPSULE AS DIRECTED ONCE DAILY.     No current facility-administered medications for this visit.       REVIEW OF SYSTEMS (Negative unless checked)  Constitutional: [] Weight loss  [] Fever  [] Chills Cardiac: [] Chest pain   [] Chest pressure   [x] Palpitations   [] Shortness of breath when laying flat   [] Shortness of breath at rest   [x] Shortness of breath with exertion. Vascular:  [] Pain in legs  with walking   [] Pain in legs at rest   [] Pain in legs when laying flat   [x] Claudication   [] Pain in feet when walking  [] Pain in feet at rest  [] Pain in feet when laying flat   [] History of DVT   [] Phlebitis   [] Swelling in legs   [] Varicose veins   [] Non-healing ulcers Pulmonary:   [] Uses home oxygen   [] Productive cough   [] Hemoptysis   [] Wheeze  [] COPD   [] Asthma Neurologic:  [] Dizziness  [] Blackouts   [] Seizures   [] History of stroke   [] History of TIA  [] Aphasia   [] Temporary blindness   [] Dysphagia   [] Weakness or numbness in arms   [] Weakness or numbness in legs Musculoskeletal:  [x] Arthritis   [] Joint swelling   [] Joint pain   [] Low back pain Hematologic:  [] Easy bruising  [] Easy bleeding   [] Hypercoagulable state   [] Anemic  [] Hepatitis Gastrointestinal:  [] Blood in stool   [] Vomiting blood  [] Gastroesophageal reflux/heartburn   [] Abdominal pain Genitourinary:  [] Chronic kidney disease   [] Difficult urination  [x] Frequent urination  [] Burning with urination   [] Hematuria Skin:  [] Rashes   [] Ulcers   [] Wounds Psychological:  [] History of anxiety   []  History of major depression.    Physical Exam BP (!) 156/92 (BP Location: Left Arm, Patient Position: Sitting, Cuff Size: Small)   Pulse 69   Resp 12   Ht 5\' 3"  (1.6 m)   Wt 159 lb (72.1 kg)   BMI 28.17 kg/m  Gen:  WD/WN, NAD Head: [place]/AT, No temporalis wasting.  Ear/Nose/Throat: Hearing grossly intact, nares w/o erythema or drainage, oropharynx w/o Erythema/Exudate Eyes: Conjunctiva clear, sclera non-icteric  Neck: trachea midline.  No bruits Pulmonary:  Good air movement, clear to auscultation bilaterally.  Cardiac: RRR, no JVD. Murmur present Vascular:  Vessel Right Left  Radial Palpable Palpable                          PT Palpable 1+ Palpable  DP Palpable Palpable   Gastrointestinal: soft, non-tender/non-distended.  Musculoskeletal: In a wheelchair.  Extremities without ischemic changes.  No deformity or atrophy.  Trace LE edema. Neurologic: Sensation grossly intact in extremities.  Symmetrical.  Speech is fluent. Motor exam as listed above. Psychiatric: Judgment intact, Mood & affect appropriate for pt's clinical situation. Dermatologic: No rashes or ulcers noted.  No cellulitis or open wounds.   Radiology Dg Chest 1 View  Result Date: 11/03/2018 CLINICAL DATA:  Followup left thoracentesis. EXAM: CHEST  1 VIEW COMPARISON:  12/04/2017 FINDINGS: Previous median  sternotomy and CABG. Previous aortic and mitral valve replacements. Heart size upper limits of normal. Chronic aortic atherosclerosis. The right chest is clear moderate left effusion with atelectasis in the left lower lung. No evidence pneumothorax post procedure. IMPRESSION: Moderate left effusion with left lower lung atelectasis. No pneumothorax post thoracentesis. Electronically Signed   By: Paulina FusiMark  Shogry M.D.   On: 11/03/2018 16:42   [place] Thoracentesis Asp Pleural Space W/img Guide  Result Date: 11/03/2018 INDICATION: Chronic left pleural effusion, loculated EXAM: ULTRASOUND GUIDED LEFT THORACENTESIS MEDICATIONS: 1% lidocaine local COMPLICATIONS: None immediate. PROCEDURE: An ultrasound guided thoracentesis was thoroughly discussed with the patient and questions answered. The benefits, risks, alternatives and complications were also discussed. The patient understands and wishes to proceed with the procedure. Written consent was obtained. Ultrasound was performed to localize and mark an adequate pocket of fluid in the left chest. The area was then prepped and draped in the normal sterile fashion. 1% Lidocaine was used for local anesthesia. Under ultrasound guidance a 6 Fr Safe-T-Centesis catheter was introduced. Thoracentesis was performed. The catheter was removed and a dressing applied. FINDINGS: A total of approximately 160 cc of dark thin pleural fluid was removed. Samples were sent to the laboratory as requested by the clinical team. IMPRESSION:  Successful ultrasound guided diagnostic left thoracentesis yielding 160 cc of pleural fluid. Electronically Signed   By: Judie PetitM.  Shick M.D.   On: 11/03/2018 14:13    Labs Recent Results (from the past 2160 hour(s))  Cytology - Non PAP;     Status: None   Collection Time: 11/03/18  1:40 PM  Result Value Ref Range   CYTOLOGY - NON GYN      Cytology - Non PAP CASE: [id number] PATIENT: Hamed Hindle Non-Gyn Cytology Report     SPECIMEN SUBMITTED: A. Pleural fluid, left  CLINICAL HISTORY: Chronic loculated left pleural effusion  PRE-OPERATIVE DIAGNOSIS: None provided  POST-OPERATIVE DIAGNOSIS: None provided.     DIAGNOSIS: A. PLEURAL FLUID, LEFT; ULTRASOUND-GUIDED THORACENTESIS: - OLD ERYTHROCYTES AND ACELLULAR MATERIAL. - NO NUCLEATED CELLS.  Comment: Slides reviewed: 2 cytospin slides, 2 ThinPrep slides, and 2 cell blocks  GROSS DESCRIPTION: A. Labeled: Left pleural fluid Received: Fresh Volume: A1 -100 mL.  A2 - 33 mL. Description: Received is a 1000 mL glass evacuated container (designated as A1) containing red, cloudy fluid.  Also received is a plastic syringe (designated as A2) containing red, cloudy fluid. Each container is submitted for ThinPrep and cell block.   Final Diagnosis performed by Ronald LoboMary Olney, MD.   Electronically signed 11/07/2018 5:18:18PM The electronic sig nature indicates that the named Attending Pathologist has evaluated the specimen  Technical component performed at [facility], [address], [place], [place] [address] Lab: [phone] Dir: Jolene SchimkeSanjai Nagendra, MD, MMM  Professional component performed at [facility], [facility], [address], [place], [place] [address] Lab: [phone] Dir: Georgiann Cockerara C. Rubinas, MD   Lactate dehydrogenase (pleural or peritoneal fluid)     Status: Abnormal   Collection Time: 11/03/18  1:40 PM  Result Value Ref Range   LD, Fluid 1,828 (H) 3 - 23 U/L   Fluid Type-FLDH CYTOPLEU     Comment:  Performed at [facility], [address] [place], [place] [address]  Body fluid cell count with differential     Status: Abnormal   Collection Time: 11/03/18  1:40 PM  Result Value Ref Range   Fluid Type-FCT CYTOPLEU    Color, Fluid RED (A) YELLOW   Appearance, Fluid HAZY (A) CLEAR  WBC, Fluid 0 cu mm   Neutrophil Count, Fluid 0 %   Lymphs, Fluid 0 %   Monocyte-Macrophage-Serous Fluid 0 %   Eos, Fluid 0 %    Comment: Performed at [facility], [address] [place], [place] [address]  Body fluid culture     Status: None   Collection Time: 11/03/18  1:40 PM  Result Value Ref Range   Specimen Description      PLEURAL Performed at [facility], [address] [place], [place] [address]    Special Requests      NONE Performed at [facility], [address] [place], [place] [address]    Gram Stain      ABUNDANT DEGENERATED CELLULAR MATERIAL PRESENT NO ORGANISMS SEEN    Culture      NO GROWTH 3 DAYS Performed at [facility], [address], [place], [place] [address]    Report Status 11/07/2018 FINAL   Protein, pleural or peritoneal fluid     Status: None   Collection Time: 11/03/18  1:40 PM  Result Value Ref Range   Total protein, fluid 4.5 g/dL   Fluid Type-FTP CYTOPLEU     Comment: Performed at [facility], [address] [place], [place] [address]  Fungus Culture With Stain     Status: None (Preliminary result)   Collection Time: 11/03/18  1:40 PM  Result Value Ref Range   Fungus Stain Final report     Comment: (NOTE) Performed At: [facility] [address] [place], [place] [id number] Jolene Schimke MD WY:[id number]    Fungus (Mycology) Culture PENDING    Fungal Source PLEURAL     Comment: Performed at [facility], [address] [place], [place] [address]  Glucose, pleural or peritoneal fluid     Status: None   Collection Time: 11/03/18  1:40 PM  Result Value Ref Range    Glucose, Fluid 36 mg/dL   Fluid Type-FGLU CYTOPLEU     Comment: Performed at [facility], [address] [place], [place] [address]  Acid Fast Smear (AFB)     Status: None   Collection Time: 11/03/18  1:40 PM  Result Value Ref Range   AFB Specimen Processing Concentration    Acid Fast Smear Negative     Comment: (NOTE) Performed At: [facility] [address] [place], [place] [id number] Jolene Schimke MD [id number]    Source (AFB) PLEURAL     Comment: Performed at [facility], [address] [place], [place] [address]  Fungus Culture Result     Status: None   Collection Time: 11/03/18  1:40 PM  Result Value Ref Range   Result 1 Comment     Comment: (NOTE) KOH/Calcofluor preparation:  no fungus observed. Performed At: [facility] [address] [place], [place] [id number] Jolene Schimke MD [id number]     Assessment/Plan:  HLD (hyperlipidemia) lipid control important in reducing the progression of atherosclerotic disease. Continue statin therapy   Essential (primary) hypertension blood pressure control important in reducing the progression of atherosclerotic disease. On appropriate oral medications.   Pain in limb We performed noninvasive studies today.  Although his vessels were noncompressible from medial calcification, he had normal brisk waveforms bilaterally and normal digital pressures and waveforms bilaterally consistent with some calcific peripheral arterial disease without flow limitation currently.  With these findings, I do not think his lower extremity symptoms are likely from lack of blood flow.  I think it  is probably a combination of neuropathy and arthritis.  PAD (peripheral artery disease) (HCC) We performed noninvasive studies today.  Although his vessels were noncompressible from medial calcification, he had normal brisk waveforms bilaterally and normal digital pressures and waveforms bilaterally consistent with  some calcific peripheral arterial disease without flow limitation currently.  I doubt that is the cause of his current symptoms.  No intervention would currently be likely to benefit him.  Plan to recheck annually.      Festus Barren 11/28/2018, 12:21 PM   This note was created with Dragon medical transcription system.  Any errors from dictation are unintentional.

## 2018-11-28 NOTE — Patient Instructions (Signed)
Peripheral Vascular Disease  Peripheral vascular disease (PVD) is a disease of the blood vessels that are not part of your heart and brain. A simple term for PVD is poor circulation. In most cases, PVD narrows the blood vessels that carry blood from your heart to the rest of your body. This can reduce the supply of blood to your arms, legs, and internal organs, like your stomach or kidneys. However, PVD most often affects a person's lower legs and feet. Without treatment, PVD tends to get worse. PVD can also lead to acute ischemic limb. This is when an arm or leg suddenly cannot get enough blood. This is a medical emergency. Follow these instructions at home: Lifestyle  Do not use any products that contain nicotine or tobacco, such as cigarettes and e-cigarettes. If you need help quitting, ask your doctor.  Lose weight if you are overweight. Or, stay at a healthy weight as told by your doctor.  Eat a diet that is low in fat and cholesterol. If you need help, ask your doctor.  Exercise regularly. Ask your doctor for activities that are right for you. General instructions  Take over-the-counter and prescription medicines only as told by your doctor.  Take good care of your feet: ? Wear comfortable shoes that fit well. ? Check your feet often for any cuts or sores.  Keep all follow-up visits as told by your doctor This is important. Contact a doctor if:  You have cramps in your legs when you walk.  You have leg pain when you are at rest.  You have coldness in a leg or foot.  Your skin changes.  You are unable to get or have an erection (erectile dysfunction).  You have cuts or sores on your feet that do not heal. Get help right away if:  Your arm or leg turns cold, numb, and blue.  Your arms or legs become red, warm, swollen, painful, or numb.  You have chest pain.  You have trouble breathing.  You suddenly have weakness in your face, arm, or leg.  You become very  confused or you cannot speak.  You suddenly have a very bad headache.  You suddenly cannot see. Summary  Peripheral vascular disease (PVD) is a disease of the blood vessels.  A simple term for PVD is poor circulation. Without treatment, PVD tends to get worse.  Treatment may include exercise, low fat and low cholesterol diet, and quitting smoking. This information is not intended to replace advice given to you by your health care provider. Make sure you discuss any questions you have with your health care provider. Document Released: 12/29/2009 Document Revised: 11/11/2016 Document Reviewed: 11/11/2016 Elsevier Interactive Patient Education  2019 Elsevier Inc.  

## 2018-11-28 NOTE — Assessment & Plan Note (Signed)
We performed noninvasive studies today.  Although his vessels were noncompressible from medial calcification, he had normal brisk waveforms bilaterally and normal digital pressures and waveforms bilaterally consistent with some calcific peripheral arterial disease without flow limitation currently.  I doubt that is the cause of his current symptoms.  No intervention would currently be likely to benefit him.  Plan to recheck annually.

## 2018-11-28 NOTE — Assessment & Plan Note (Signed)
lipid control important in reducing the progression of atherosclerotic disease. Continue statin therapy  

## 2018-11-28 NOTE — Assessment & Plan Note (Signed)
We performed noninvasive studies today.  Although his vessels were noncompressible from medial calcification, he had normal brisk waveforms bilaterally and normal digital pressures and waveforms bilaterally consistent with some calcific peripheral arterial disease without flow limitation currently.  With these findings, I do not think his lower extremity symptoms are likely from lack of blood flow.  I think it is probably a combination of neuropathy and arthritis.

## 2018-11-28 NOTE — Assessment & Plan Note (Signed)
blood pressure control important in reducing the progression of atherosclerotic disease. On appropriate oral medications.  

## 2018-12-13 LAB — FUNGUS CULTURE RESULT

## 2018-12-13 LAB — FUNGAL ORGANISM REFLEX

## 2018-12-13 LAB — FUNGUS CULTURE WITH STAIN

## 2018-12-21 LAB — ACID FAST CULTURE WITH REFLEXED SENSITIVITIES (MYCOBACTERIA): Acid Fast Culture: NEGATIVE

## 2019-02-19 IMAGING — CR DG ABDOMEN 2V
3 series · 3 of 3 positions shown · non-contrast
Comparison: Portable chest radiograph 12/04/2017. Chest CT
07/08/2016 and earlier.

CLINICAL DATA: 83-year-old male with constipation.

EXAM:
ABDOMEN - 2 VIEW

[abdomen erect]
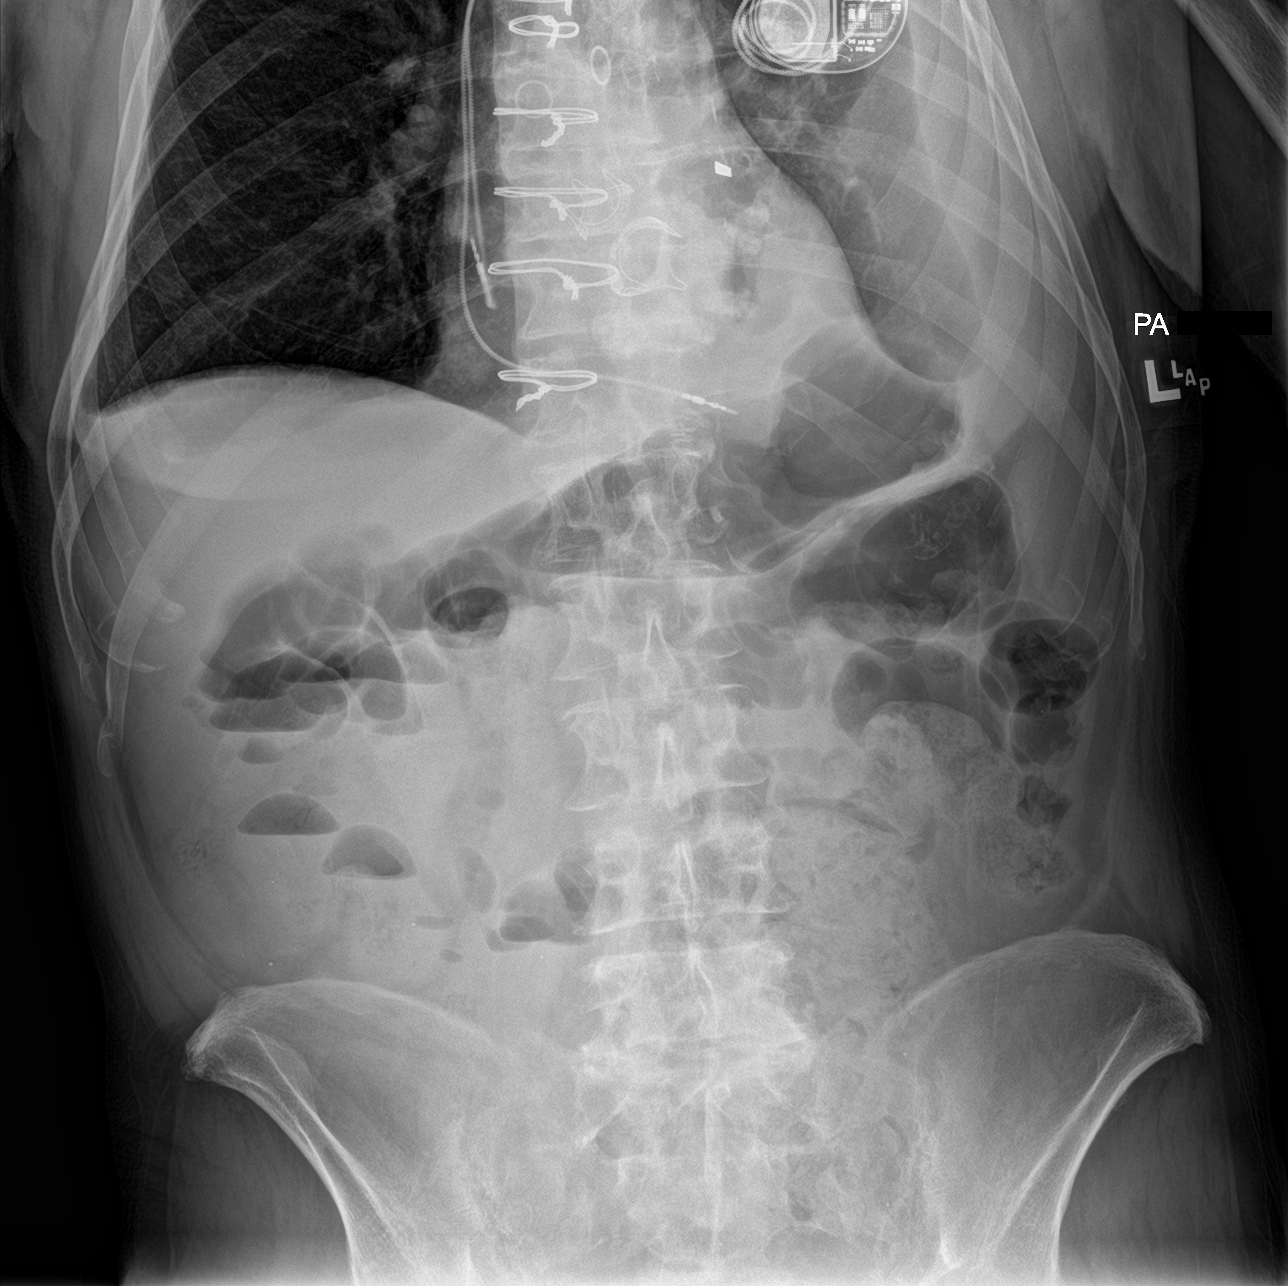

[abdomen supine (1 of 2)]
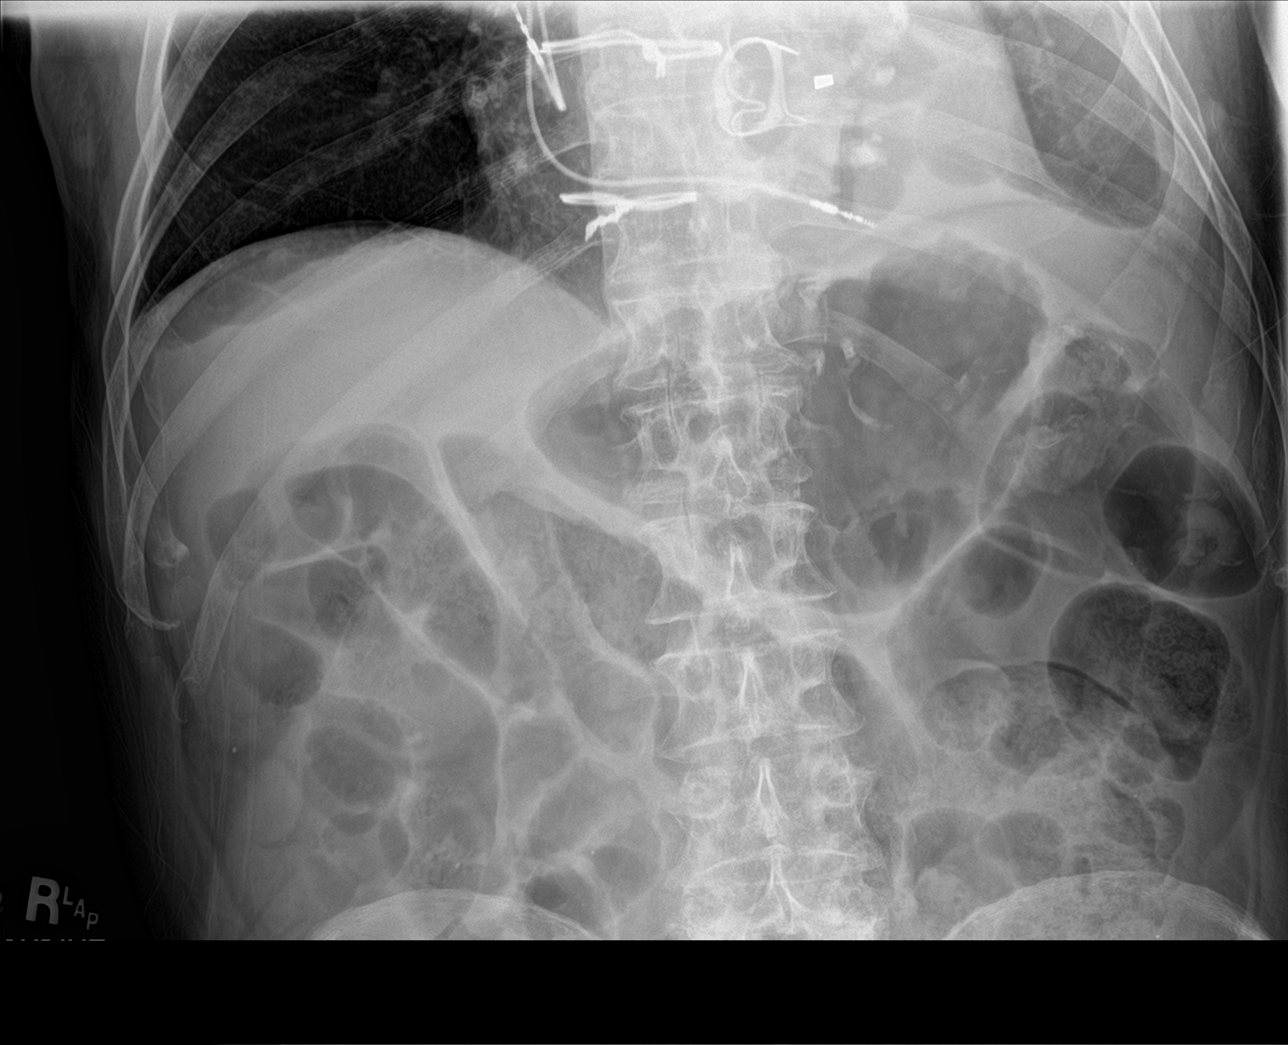

[abdomen supine (2 of 2)]
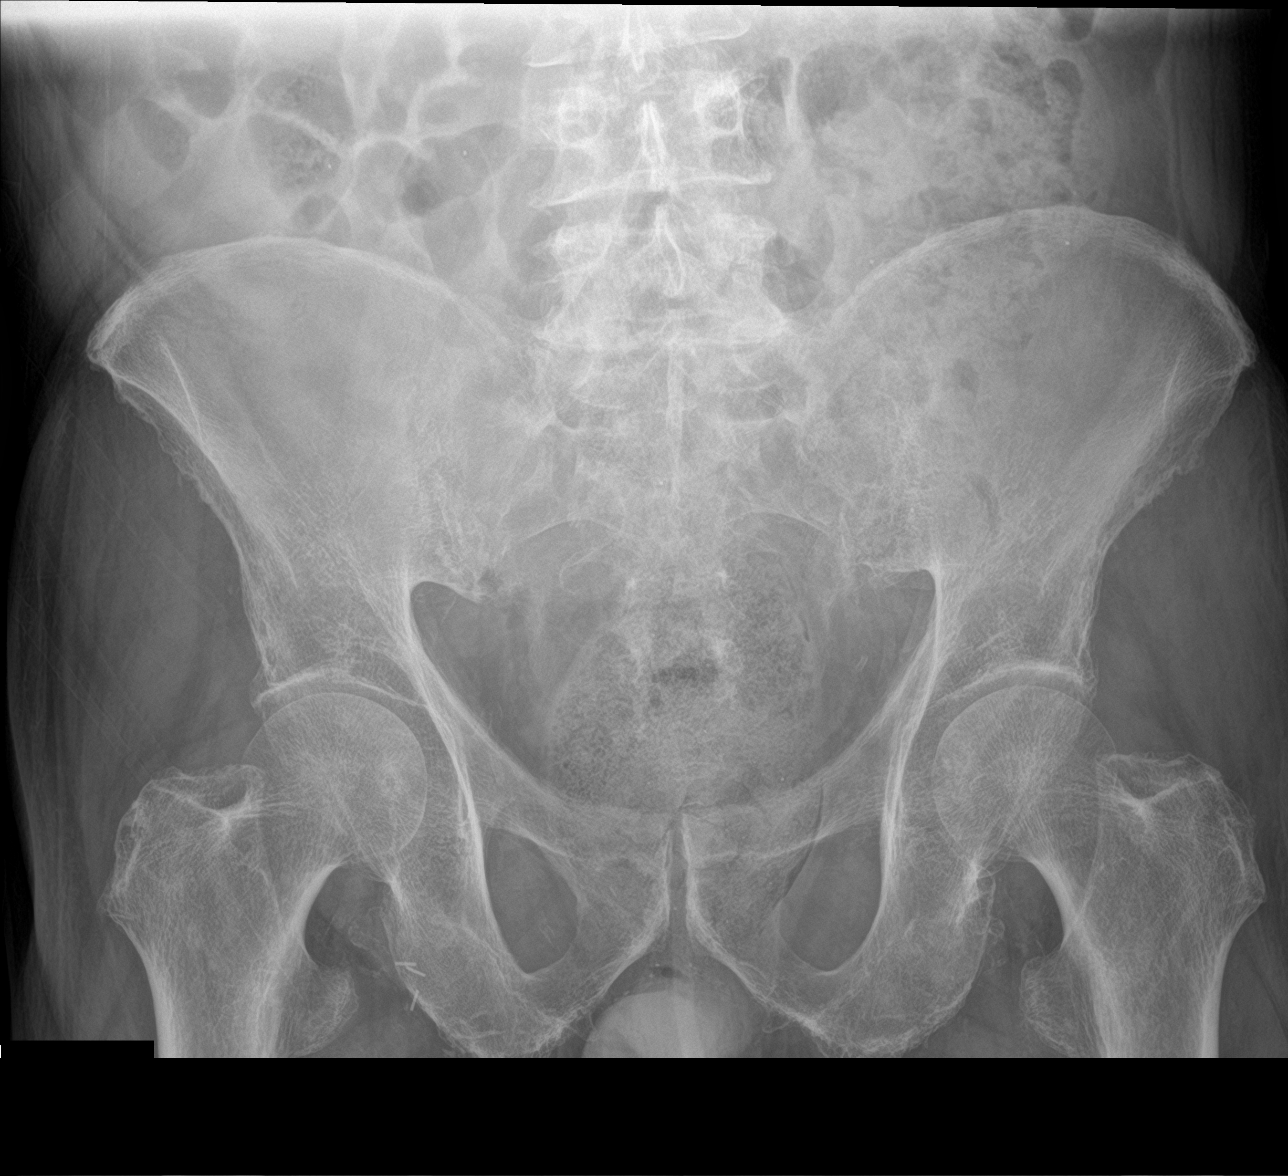

[3 of 3 positions shown; findings below may reference images not displayed]

FINDINGS: Upright and supine views of the abdomen and pelvis. Visible chest
appears stable since [REDACTED], with chronic left pleural effusion.
Mild blunting of the right costophrenic angle appears stable since
3155 with no right pleural effusion at that time.

No pneumoperitoneum. Gas in non dilated stomach and small bowel.
There are multiple small bowel air-fluid levels in the right
abdomen. There is retained stool in the left and rectosigmoid colon
which is moderate to large in volume. No dilated large bowel.

No acute osseous abnormality identified. Aortoiliac and proximal
femoral artery calcified atherosclerosis. Right inguinal surgical
clips. There is a Foley catheter in place.
IMPRESSION: 1. Nonobstructed bowel gas pattern and no free air. Moderate to
large volume of retained stool in the descending and rectosigmoid
colon. Consider fecal impaction.
2. Stable visible chest since [REDACTED]. Chronic left pleural
effusion.

## 2019-11-30 ENCOUNTER — Ambulatory Visit (INDEPENDENT_AMBULATORY_CARE_PROVIDER_SITE_OTHER): Payer: Medicare Other | Admitting: Vascular Surgery

## 2019-11-30 ENCOUNTER — Encounter (INDEPENDENT_AMBULATORY_CARE_PROVIDER_SITE_OTHER): Payer: Medicare Other

## 2020-06-20 ENCOUNTER — Other Ambulatory Visit: Payer: Self-pay | Admitting: Neurology

## 2020-06-20 DIAGNOSIS — F0391 Unspecified dementia with behavioral disturbance: Secondary | ICD-10-CM

## 2020-07-10 ENCOUNTER — Ambulatory Visit: Payer: Medicare Other

## 2020-10-14 ENCOUNTER — Ambulatory Visit (HOSPITAL_COMMUNITY): Payer: Medicare Other

## 2020-10-24 ENCOUNTER — Other Ambulatory Visit: Payer: Self-pay

## 2020-10-24 ENCOUNTER — Ambulatory Visit (HOSPITAL_COMMUNITY)
Admission: RE | Admit: 2020-10-24 | Discharge: 2020-10-24 | Disposition: A | Discharge status: Home / Self Care | Payer: Medicare Other | Source: Ambulatory Visit | Attending: Neurology | Admitting: Neurology

## 2020-10-24 DIAGNOSIS — F0391 Unspecified dementia with behavioral disturbance: Secondary | ICD-10-CM | POA: Insufficient documentation

## 2020-10-24 NOTE — Progress Notes (Signed)
Patient here today for MRI with pacemaker. [facility] sent Kathlene November- [employer] Rep and [facility]. Orders received for DOO rate of 80. Will re-program once scan is completed.

## 2021-02-14 ENCOUNTER — Inpatient Hospital Stay (HOSPITAL_COMMUNITY)
Admission: EM | Admit: 2021-02-14 | Discharge: 2021-02-17 | DRG: 071 | Disposition: A | Discharge status: Hospice | Payer: Medicare Other | Attending: Internal Medicine | Admitting: Internal Medicine

## 2021-02-14 ENCOUNTER — Emergency Department (HOSPITAL_COMMUNITY): Payer: Medicare Other

## 2021-02-14 ENCOUNTER — Encounter (HOSPITAL_COMMUNITY): Payer: Self-pay | Admitting: Emergency Medicine

## 2021-02-14 ENCOUNTER — Other Ambulatory Visit: Payer: Self-pay

## 2021-02-14 DIAGNOSIS — I10 Essential (primary) hypertension: Secondary | ICD-10-CM | POA: Diagnosis present

## 2021-02-14 DIAGNOSIS — Z952 Presence of prosthetic heart valve: Secondary | ICD-10-CM

## 2021-02-14 DIAGNOSIS — Z823 Family history of stroke: Secondary | ICD-10-CM

## 2021-02-14 DIAGNOSIS — E876 Hypokalemia: Secondary | ICD-10-CM | POA: Diagnosis present

## 2021-02-14 DIAGNOSIS — N400 Enlarged prostate without lower urinary tract symptoms: Secondary | ICD-10-CM | POA: Diagnosis present

## 2021-02-14 DIAGNOSIS — E039 Hypothyroidism, unspecified: Secondary | ICD-10-CM | POA: Diagnosis present

## 2021-02-14 DIAGNOSIS — R053 Chronic cough: Secondary | ICD-10-CM | POA: Diagnosis present

## 2021-02-14 DIAGNOSIS — F05 Delirium due to known physiological condition: Secondary | ICD-10-CM | POA: Diagnosis present

## 2021-02-14 DIAGNOSIS — G9341 Metabolic encephalopathy: Secondary | ICD-10-CM | POA: Diagnosis not present

## 2021-02-14 DIAGNOSIS — R410 Disorientation, unspecified: Secondary | ICD-10-CM | POA: Diagnosis not present

## 2021-02-14 DIAGNOSIS — Z20822 Contact with and (suspected) exposure to covid-19: Secondary | ICD-10-CM | POA: Diagnosis present

## 2021-02-14 DIAGNOSIS — G4733 Obstructive sleep apnea (adult) (pediatric): Secondary | ICD-10-CM | POA: Diagnosis present

## 2021-02-14 DIAGNOSIS — Z87891 Personal history of nicotine dependence: Secondary | ICD-10-CM

## 2021-02-14 DIAGNOSIS — J189 Pneumonia, unspecified organism: Secondary | ICD-10-CM

## 2021-02-14 DIAGNOSIS — E162 Hypoglycemia, unspecified: Secondary | ICD-10-CM | POA: Diagnosis not present

## 2021-02-14 DIAGNOSIS — I495 Sick sinus syndrome: Secondary | ICD-10-CM | POA: Diagnosis present

## 2021-02-14 DIAGNOSIS — E785 Hyperlipidemia, unspecified: Secondary | ICD-10-CM | POA: Diagnosis present

## 2021-02-14 DIAGNOSIS — R1310 Dysphagia, unspecified: Secondary | ICD-10-CM | POA: Diagnosis present

## 2021-02-14 DIAGNOSIS — E079 Disorder of thyroid, unspecified: Secondary | ICD-10-CM | POA: Diagnosis present

## 2021-02-14 DIAGNOSIS — Z79890 Hormone replacement therapy (postmenopausal): Secondary | ICD-10-CM

## 2021-02-14 DIAGNOSIS — I2510 Atherosclerotic heart disease of native coronary artery without angina pectoris: Secondary | ICD-10-CM | POA: Diagnosis present

## 2021-02-14 DIAGNOSIS — Z951 Presence of aortocoronary bypass graft: Secondary | ICD-10-CM

## 2021-02-14 DIAGNOSIS — Z8249 Family history of ischemic heart disease and other diseases of the circulatory system: Secondary | ICD-10-CM

## 2021-02-14 DIAGNOSIS — Z66 Do not resuscitate: Secondary | ICD-10-CM | POA: Diagnosis present

## 2021-02-14 DIAGNOSIS — J90 Pleural effusion, not elsewhere classified: Secondary | ICD-10-CM | POA: Diagnosis present

## 2021-02-14 DIAGNOSIS — Z7982 Long term (current) use of aspirin: Secondary | ICD-10-CM

## 2021-02-14 DIAGNOSIS — Z950 Presence of cardiac pacemaker: Secondary | ICD-10-CM

## 2021-02-14 DIAGNOSIS — R946 Abnormal results of thyroid function studies: Secondary | ICD-10-CM | POA: Diagnosis present

## 2021-02-14 DIAGNOSIS — Z85828 Personal history of other malignant neoplasm of skin: Secondary | ICD-10-CM

## 2021-02-14 DIAGNOSIS — F0390 Unspecified dementia without behavioral disturbance: Secondary | ICD-10-CM | POA: Diagnosis present

## 2021-02-14 DIAGNOSIS — Z79899 Other long term (current) drug therapy: Secondary | ICD-10-CM

## 2021-02-14 LAB — COMPREHENSIVE METABOLIC PANEL
ALT: 17 U/L (ref 0–44)
AST: 44 U/L — ABNORMAL HIGH (ref 15–41)
Albumin: 4.3 g/dL (ref 3.5–5.0)
Alkaline Phosphatase: 86 U/L (ref 38–126)
Anion gap: 9 (ref 5–15)
BUN: 26 mg/dL — ABNORMAL HIGH (ref 8–23)
CO2: 29 mmol/L (ref 22–32)
Calcium: 10.9 mg/dL — ABNORMAL HIGH (ref 8.9–10.3)
Chloride: 99 mmol/L (ref 98–111)
Creatinine, Ser: 1.34 mg/dL — ABNORMAL HIGH (ref 0.61–1.24)
GFR, Estimated: 52 mL/min — ABNORMAL LOW (ref >60)
Glucose, Bld: 90 mg/dL (ref 70–99)
Potassium: 3.3 mmol/L — ABNORMAL LOW (ref 3.5–5.1)
Sodium: 137 mmol/L (ref 135–145)
Total Bilirubin: 1 mg/dL (ref 0.3–1.2)
Total Protein: 8.3 g/dL — ABNORMAL HIGH (ref 6.5–8.1)

## 2021-02-14 LAB — LACTIC ACID, PLASMA
Lactic Acid, Venous: 1.2 mmol/L (ref 0.5–1.9)
Lactic Acid, Venous: 1.6 mmol/L (ref 0.5–1.9)

## 2021-02-14 LAB — CBC
HCT: 37 % — ABNORMAL LOW (ref 39.0–52.0)
Hemoglobin: 12 g/dL — ABNORMAL LOW (ref 13.0–17.0)
MCH: 30.5 pg (ref 26.0–34.0)
MCHC: 32.4 g/dL (ref 30.0–36.0)
MCV: 93.9 fL (ref 80.0–100.0)
Platelets: 180 10*3/uL (ref 150–400)
RBC: 3.94 MIL/uL — ABNORMAL LOW (ref 4.22–5.81)
RDW: 12.4 % (ref 11.5–15.5)
WBC: 6.3 10*3/uL (ref 4.0–10.5)
nRBC: 0 % (ref 0.0–0.2)

## 2021-02-14 LAB — RESP PANEL BY RT-PCR (FLU A&B, COVID) ARPGX2
Influenza A by PCR: NEGATIVE
Influenza B by PCR: NEGATIVE
SARS Coronavirus 2 by RT PCR: NEGATIVE

## 2021-02-14 LAB — TSH: TSH: 6.89 u[IU]/mL — ABNORMAL HIGH (ref 0.350–4.500)

## 2021-02-14 LAB — PROCALCITONIN: Procalcitonin: <0.1 ng/mL

## 2021-02-14 MED ORDER — SODIUM CHLORIDE 0.9 % IV SOLN
1.0000 g | Freq: Once | INTRAVENOUS | Status: AC
  Administered 2021-02-14: 1 g via INTRAVENOUS
  Filled 2021-02-14: qty 10

## 2021-02-14 MED ORDER — SODIUM CHLORIDE 0.9 % IV SOLN
500.0000 mg | INTRAVENOUS | Status: DC
  Administered 2021-02-15: 500 mg via INTRAVENOUS
  Filled 2021-02-14: qty 500

## 2021-02-14 MED ORDER — SODIUM CHLORIDE 0.9 % IV BOLUS
500.0000 mL | Freq: Once | INTRAVENOUS | Status: AC
  Administered 2021-02-14: 500 mL via INTRAVENOUS

## 2021-02-14 NOTE — ED Triage Notes (Signed)
EMS called out to pt's house for c/o AMS. Per family, pt had been diagnosed with UTI recently and placed on Keflex but that they had noticed a change in his mentation over the last couple of days.

## 2021-02-14 NOTE — ED Provider Notes (Signed)
Emergency Department Provider Note   I have reviewed the triage vital signs and the nursing notes.   HISTORY  Chief Complaint Altered Mental Status   HPI Glenn [name] is a 87 y.o. male with past medical history reviewed below including pacemaker w/ SSS presents to the emergency department with increased confusion.  According to EMS, the patient was recently diagnosed with a urinary tract infection and started on Keflex. They have been giving this medication for the last 2-3 days with no improvement.  Patient is confused and cannot provide significant history.  No family available at the time of my initial evaluation for additional information. Level 5 caveat: confusion.   The patient's daughter arrives to bedside somewhat later after my initial evaluation.  She states that patient had his skin cancer removed on the nose at [facility] on Tuesday and Keflex was started prophylactically after that procedure.  He seemed to get slightly more confused after the procedure and she took him to the PCP who suspected a urinary tract infection and started him on Cipro.  He has been on that for 3 days but symptoms have worsened gradually over that time.  He has had some mild cough but no documented fevers at home.    Past Medical History:  Diagnosis Date  . CAD (coronary artery disease)    a. 12/2006 CABG x 3: LIMA->LAD, VG->PDA, VG->OM.  Marland Kitchen Constipation   . Elevated PSA   . Enlarged prostate   . H/O aortic valve replacement    a. 12/2006 Tissue AVR @ time of CABG.  . H/O mitral valve replacement    a. 12/2006 MV repair @ time of CABG.  . High cholesterol   . Hypertension   . Pleural effusion, left 2009   Chronic  . Sick sinus syndrome (HCC)    a. 12/2006 s/p MDT dual chamber PPM.  . Thyroid disease     Patient Active Problem List   Diagnosis Date Noted  . Pain in limb 11/28/2018  . PAD (peripheral artery disease) (HCC) 11/28/2018  . Bradycardia 12/04/2017  . Persistent severe sinus  bradycardia 12/04/2017  . History of elevated PSA 09/28/2015  . BPH with obstruction/lower urinary tract symptoms 03/30/2015  . CAFL (chronic airflow limitation) (HCC) 03/25/2015  . Aortic valve defect 07/05/2014  . Asthma with chronic obstructive pulmonary disease (COPD) (HCC) 07/05/2014  . CHF with unknown LVEF (HCC) 07/05/2014  . CAD (coronary artery disease) 07/05/2014  . HLD (hyperlipidemia) 07/05/2014  . Hypertensive urgency 07/05/2014  . Anemia, iron deficiency 07/05/2014  . Disorder of mitral valve 07/05/2014  . Arthritis, degenerative 07/05/2014  . Sick sinus syndrome (HCC) 07/05/2014  . CKD (chronic kidney disease) stage 3, GFR 30-59 ml/min (HCC) 07/05/2014  . Chronic kidney disease (CKD), stage III (moderate) (HCC) 07/05/2014  . CCF (congestive cardiac failure) (HCC) 07/05/2014  . Atherosclerosis of coronary artery 07/05/2014  . Essential (primary) hypertension 07/05/2014  . BPH (benign prostatic hypertrophy) 05/03/2014  . Benign prostatic hypertrophy without urinary obstruction 05/03/2014  . Pleural effusion, left 10/19/2007    Past Surgical History:  Procedure Laterality Date  . AoV  replacement    . CORONARY ARTERY BYPASS GRAFT    . INTRAOCULAR LENS INSERTION  2007  . MEDIAL PARTIAL KNEE REPLACEMENT  2011  . PACEMAKER INSERTION  2008  . TURP VAPORIZATION  2010    Allergies Patient has no known allergies.  Family History  Problem Relation Age of Onset  . Heart attack Father   .  Hypertension Father   . Stroke Mother   . Cancer Neg Hx        Kidney, Bladder, Prostate    Social History Social History   Tobacco Use  . Smoking status: Former Smoker    Packs/day: 1.00    Years: 15.00    Pack years: 15.00    Types: Cigarettes    Quit date: 07/06/1967    Years since quitting: 53.6  . Smokeless tobacco: Never Used  . Tobacco comment: Quit over 38 years ago  Substance Use Topics  . Alcohol use: No    Alcohol/week: 0.0 standard drinks    Comment:  Occasional use in the past.  . Drug use: No    Review of Systems  Level 5 caveat: AMS  ____________________________________________   PHYSICAL EXAM:  VITAL SIGNS: ED Triage Vitals  Enc Vitals Group     BP 02/14/21 2000 122/65     Pulse Rate 02/14/21 2000 71     Resp 02/14/21 2000 14     Temp 02/14/21 2000 98.9 F (37.2 C)     Temp Source 02/14/21 2000 Oral     SpO2 02/14/21 2000 100 %     Weight 02/14/21 1956 158 lb 11.7 oz (72 kg)     Height 02/14/21 1956 5\' 3"  (1.6 m)   Constitutional: Alert but confused and unable to provide much history. No acute distress. Eyes: Conjunctivae are normal.  Head: Atraumatic. Nose: No congestion/rhinnorhea. Mouth/Throat: Mucous membranes are moist. Neck: No stridor.   Cardiovascular: Normal rate, regular rhythm. Good peripheral circulation. Grossly normal heart sounds.   Respiratory: Normal respiratory effort.  No retractions. Lungs CTAB. Gastrointestinal: Soft and nontender. No distention.  Musculoskeletal: No lower extremity tenderness nor edema. No gross deformities of extremities. Neurologic: Patient's speech is difficult to assess as he is somewhat encephalopathic and not fully conversational but when he does speak I do not appreciate any gross deficits.  He has no facial asymmetry. No gross focal neurologic deficits are appreciated. 5/5 strength in the bilateral upper and lower extremities.  Skin:  Skin is warm, dry and intact. No rash noted.   ____________________________________________   LABS (all labs ordered are listed, but only abnormal results are displayed)  Labs Reviewed  COMPREHENSIVE METABOLIC PANEL - Abnormal; Notable for the following components:      Result Value   Potassium 3.3 (*)    BUN 26 (*)    Creatinine, Ser 1.34 (*)    Calcium 10.9 (*)    Total Protein 8.3 (*)    AST 44 (*)    GFR, Estimated 52 (*)    All other components within normal limits  CBC - Abnormal; Notable for the following components:    RBC 3.94 (*)    Hemoglobin 12.0 (*)    HCT 37.0 (*)    All other components within normal limits  TSH - Abnormal; Notable for the following components:   TSH 6.890 (*)    All other components within normal limits  RESP PANEL BY RT-PCR (FLU A&B, COVID) ARPGX2  URINE CULTURE  LACTIC ACID, PLASMA  LACTIC ACID, PLASMA  URINALYSIS, ROUTINE W REFLEX MICROSCOPIC  PROCALCITONIN  CBG MONITORING, ED   ____________________________________________  EKG   EKG Interpretation  Date/Time:  Saturday February 14 2021 20:01:37 EDT Ventricular Rate:  71 PR Interval:  96 QRS Duration: 169 QT Interval:  478 QTC Calculation: 520 R Axis:   -73 Text Interpretation: AV PACED RHYTHM Short PR interval Left bundle branch block Confirmed  by Alona Bene [phone]) on 02/14/2021 8:07:45 PM       ____________________________________________  RADIOLOGY  CT Head Wo Contrast  Result Date: 02/14/2021 CLINICAL DATA:  Altered level of consciousness, change in mentation, urinary tract infection EXAM: CT HEAD WITHOUT CONTRAST TECHNIQUE: Contiguous axial images were obtained from the base of the skull through the vertex without intravenous contrast. COMPARISON:  12/02/2009 FINDINGS: Brain: Chronic lacunar infarcts are seen within the bilateral basal ganglia. No acute infarct or hemorrhage. Lateral ventricles and midline structures are unremarkable. No acute extra-axial fluid collections. No mass effect. Vascular: Stable atherosclerosis.  No hyperdense vessel. Skull: Normal. Negative for fracture or focal lesion. Sinuses/Orbits: Partial opacification left frontal sinus. Remaining paranasal sinuses are clear. Other: None. IMPRESSION: 1. No acute intracranial process. 2. Chronic lacunar infarcts bilateral basal ganglia. 3. Left frontal sinus disease. Electronically Signed   By: Sharlet Salina M.D.   On: 02/14/2021 21:09   DG Chest Portable 1 View  Result Date: 02/14/2021 CLINICAL DATA:  Altered level of consciousness,  urinary tract infection, change in mentation EXAM: PORTABLE CHEST 1 VIEW COMPARISON:  11/03/2018 FINDINGS: Single frontal view of the chest demonstrates stable dual lead pacemaker, with postsurgical changes from median sternotomy and aortic and mitral valve prostheses. Increased veiling opacity at the left lung base consistent with enlarging left pleural effusion and left basilar consolidation. Right chest is clear. No pneumothorax. No acute bony abnormalities. IMPRESSION: 1. Increased veiling opacity at the left lung base, consistent with left basilar consolidation and enlarging left pleural effusion. Electronically Signed   By: Sharlet Salina M.D.   On: 02/14/2021 21:07    ____________________________________________   PROCEDURES  Procedure(s) performed:   Procedures  None  ____________________________________________   INITIAL IMPRESSION / ASSESSMENT AND PLAN / ED COURSE  Pertinent labs & imaging results that were available during my care of the patient were reviewed by me and considered in my medical decision making (see chart for details).   Patient presents emergency department with altered mental status coming from home with EMS.  Apparently diagnosed recently with urinary tract infection although in chart review I do not see these lab tests.  Patient has apparently been on Keflex with no response.  No focal neurodeficits.  Patient seems more encephalopathic and confused rather than focally weak for now.  I will perform CT imaging of the head along with labs, chest x-ray, COVID swab. Will attempt to contact family for additional information.   10:30 PM  Chest x-ray shows consolidation in the left lower lobe in association with a pleural effusion.  This seems worse in comparison to prior films.  His white count and lactic acid are normal.  TSH is elevated.  His CT head shows chronic changes.  Unclear if this is medication side effect after starting Cipro, partially treated urinary tract  infection, pneumonia.  Patient remains encephalopathic worse than his baseline with dementia per daughter at bedside and plan for admission.   Discussed patient's case with [facility] to request admission. Patient and family (if present) updated with plan. Care transferred to [facility] service.  I reviewed all nursing notes, vitals, pertinent old records, EKGs, labs, imaging (as available).  ____________________________________________  FINAL CLINICAL IMPRESSION(S) / ED DIAGNOSES  Final diagnoses:  Disorientation  Community acquired pneumonia of left lower lobe of lung    MEDICATIONS GIVEN DURING THIS VISIT:  Medications  cefTRIAXone (ROCEPHIN) 1 g in sodium chloride 0.9 % 100 mL IVPB (has no administration in time range)  azithromycin (ZITHROMAX) 500 mg  in sodium chloride 0.9 % 250 mL IVPB (has no administration in time range)  sodium chloride 0.9 % bolus 500 mL (500 mLs Intravenous New Bag/Given 02/14/21 2106)    Note:  This document was prepared using Dragon voice recognition software and may include unintentional dictation errors.  Alona Bene, MD, [facility]    Timiyah Romito, Arlyss Repress, MD 02/14/21 [phone]

## 2021-02-14 NOTE — ED Notes (Signed)
ED Provider at bedside. 

## 2021-02-14 NOTE — H&P (Signed)
TRH H&P    Patient Demographics:    Glenn [name], is a 87 y.o. male  MRN: [id number]  DOB - 1934/06/26  Admit Date - 02/14/2021  Referring MD/NP/PA: Long  Outpatient Primary MD for the patient is Glenn Downer, NP  Patient coming from: Home  Chief complaint- altered mental status   HPI:    Glenn [name]  is a 87 y.o. male, with history of thyroid disease, HTN, HLD, mitral valve replacement, nad more presents to the ED with a chief complaint of altered mental status. Patient is completely disoriented, so he is not able to provide any history. Daughter, whom patient has lived with for 15 years, is at bedside. She reports that he has early dementia. At baseline he can ambulate with a walker, recognize those around him, and hold a normal conversation. He would not know current events, the year, or the president. Daughter reports that patient had a procedure to remove skin cancer on his nose on April 26. He was put on keflex after the procedure. He was slightly altered on the 27th. Daughter reports that, that day - he had more difficulty with ambulation than his norm, and he intermittently had incoherent mumblings. Then on the 28th he was completely confused, and wouldn't get out of bed. Daughter called the PCP and they changed Keflex to cipro. Patient had a similar procedure 2 weeks ago, for which he took cipro and had no altered mental status at that time. Patient has not had any known fevers, signs or symptoms of infection. He does have a chronic cough that he has seen pulmonology about. Daughter reports that patient has not had any diarrhea, or change in urine. He has no history of Bipolar disorder, mania, or other psychiatric disorder. He take levothyroxine and has not missed any doses. He was not prescribed any new pain meds after his procedure on the 26th. No other complaints at this time.  Patient does not  smoke, does not drink, and does not use illicit drugs. Patient is not vaxed for covid. Daughter reports that patient is DNR.  In the ED Afebrile with stable vitals. No leukocytosis with WBC 6.3, Hgb 12.0 Chem = hypokalemia 3.3, Cr at baseline Lactic acid 1.2 TSH 6.890 CT head without acute changes CXR = Left basilar consolidation and enlarging pleural effusion Resp panel pending UA pending Urine culture pending NaCl bolus Neuro checks q 2 Rocephin and Zithromax started    Review of systems:    ROS could not be obtained 2/2 patient altered mental status.    Past History of the following :    Past Medical History:  Diagnosis Date  . CAD (coronary artery disease)    a. 12/2006 CABG x 3: LIMA->LAD, VG->PDA, VG->OM.  Marland Kitchen Constipation   . Elevated PSA   . Enlarged prostate   . H/O aortic valve replacement    a. 12/2006 Tissue AVR @ time of CABG.  . H/O mitral valve replacement    a. 12/2006 MV repair @ time of CABG.  . High cholesterol   .  Hypertension   . Pleural effusion, left 2009   Chronic  . Sick sinus syndrome (HCC)    a. 12/2006 s/p MDT dual chamber PPM.  . Thyroid disease       Past Surgical History:  Procedure Laterality Date  . AoV  replacement    . CORONARY ARTERY BYPASS GRAFT    . INTRAOCULAR LENS INSERTION  2007  . MEDIAL PARTIAL KNEE REPLACEMENT  2011  . PACEMAKER INSERTION  2008  . TURP VAPORIZATION  2010      Social History:      Social History   Tobacco Use  . Smoking status: Former Smoker    Packs/day: 1.00    Years: 15.00    Pack years: 15.00    Types: Cigarettes    Quit date: 07/06/1967    Years since quitting: 53.6  . Smokeless tobacco: Never Used  . Tobacco comment: Quit over 38 years ago  Substance Use Topics  . Alcohol use: No    Alcohol/week: 0.0 standard drinks    Comment: Occasional use in the past.       Family History :     Family History  Problem Relation Age of Onset  . Heart attack Father   . Hypertension  Father   . Stroke Mother   . Cancer Neg Hx        Kidney, Bladder, Prostate      Home Medications:   Prior to Admission medications   Medication Sig Start Date End Date Taking? Authorizing Provider  aspirin EC 325 MG tablet Take 325 mg by mouth at bedtime.    Yes [provider]  cephALEXin (KEFLEX) 500 MG capsule Take 1 capsule by mouth 2 (two) times daily. 02/10/21 02/17/21 Yes [provider]  ciprofloxacin (CIPRO) 250 MG tablet Take 250 mg by mouth 2 (two) times daily. 02/13/21  Yes [provider]  donepezil (ARICEPT) 5 MG tablet Take 5 mg by mouth at bedtime. 12/08/20 12/08/21 Yes [provider]  ferrous sulfate 325 (65 FE) MG tablet Take 325 mg by mouth daily with breakfast.   Yes [provider]  fluticasone (FLONASE) 50 MCG/ACT nasal spray Place 2 sprays into both nostrils daily as needed for allergies.  05/10/14  Yes [provider]  levothyroxine (SYNTHROID, LEVOTHROID) 88 MCG tablet Take 88 mcg by mouth daily.  07/02/14  Yes [provider]  lisinopril (PRINIVIL,ZESTRIL) 5 MG tablet Take 5 mg by mouth daily. 03/03/15  Yes [provider]  lovastatin (MEVACOR) 40 MG tablet Take 40 mg by mouth at bedtime. 09/22/15  Yes [provider]  metoprolol succinate (TOPROL-XL) 50 MG 24 hr tablet Take 50 mg by mouth daily. 02/05/21  Yes [provider]  Multiple Vitamin (MULTI-VITAMINS) TABS Take 1 tablet by mouth daily.   Yes [provider]  omeprazole (PRILOSEC) 20 MG capsule Take 1 capsule by mouth daily. 02/05/21  Yes [provider]  cetirizine (ZYRTEC) 10 MG tablet TAKE 1 TABLET BY MOUTH ONCE DAILY. Patient not taking: No sig reported 06/11/16   [provider]  Inulin (FIBER CHOICE PO) Take 2 capsules by mouth daily. Patient not taking: No sig reported    [provider]  nitrofurantoin, macrocrystal-monohydrate, (MACROBID) 100 MG capsule Take 1 capsule (100 mg total)  by mouth every 12 (twelve) hours. Patient not taking: No sig reported 01/17/18   Michiel CowboyMcGowan, Shannon A, PA-C  rivastigmine (EXELON) 1.5 MG capsule Take by mouth. Patient not taking: No sig reported 11/28/20 11/28/21  [provider]  tiotropium (SPIRIVA HANDIHALER) 18 MCG inhalation capsule INHALE 1 CAPSULE AS DIRECTED ONCE DAILY. Patient not taking: No sig reported 08/10/16   [provider]     Allergies:    No Known Allergies   Physical Exam:   Vitals  Blood pressure 136/65, pulse 64, temperature 98.9 F (37.2 C), temperature source Oral, resp. rate 17, height 5\' 3"  (1.6 m), weight 72 kg, SpO2 100 %.  1.  General:  Patient laying supine in bed in no acute distress  2. Psychiatric: Opens eyes to name, responds in incoherent mumbling, could not assess further  3. Neurologic: Opens eyes to voice, withdraws from pain in all 4 extremities, protecting airway, face symmetric  4. HEENMT:  Head is atraumatic normocephalic, pupils are reactive, crusting on eyelids, neck is supple, trachea is midline, mucous membranes are moist  5. Respiratory : Diminished in the left lower lung field, no wheezing, rhonchi, or rales, no increase in work of breathing, no cyanosis  6. Cardiovascular : Heart rate is normal, rhythm is regular, systolic murmur, S1 S2 auscultated, no peripheral edema  7. Gastrointestinal:  Abdomen is soft, non distended, no withdrawal or grimace to palpation, no masses palpated, bowel sounds normal  8. Skin:  Skin is warm, dry, and intact. Dressings on nose and left forearm from previous excisions of skin cancer  9.Musculoskeletal:  No calf tenderness, no peripheral edema, peripheral pulses palpated    Data Review:    CBC Recent Labs  Lab 02/14/21 2100  WBC 6.3  HGB 12.0*  HCT 37.0*  PLT 180  MCV 93.9  MCH 30.5  MCHC 32.4  RDW 12.4    ------------------------------------------------------------------------------------------------------------------  Results for orders placed or performed during the hospital encounter of 02/14/21 (from the past 48 hour(s))  Resp Panel by RT-PCR (Flu A&B, Covid) Nasopharyngeal Swab     Status: None   Collection Time: 02/14/21  8:14 PM   Specimen: Nasopharyngeal Swab; Nasopharyngeal(NP) swabs in vial transport medium  Result Value Ref Range   SARS Coronavirus 2 by RT PCR NEGATIVE NEGATIVE    Comment: (NOTE) SARS-CoV-2 target nucleic acids are NOT DETECTED.  The SARS-CoV-2 RNA is generally detectable in upper respiratory specimens during the acute phase of infection. The lowest concentration of SARS-CoV-2 viral copies this assay can detect is 138 copies/mL. A negative result does not preclude SARS-Cov-2 infection and should not be used as the sole basis for treatment or other patient management decisions. A negative result may occur with  improper specimen collection/handling, submission of specimen other than nasopharyngeal swab, presence of viral mutation(s) within the areas targeted by this assay, and inadequate number of viral copies(<138 copies/mL). A negative result must be combined with clinical observations, patient history, and epidemiological information. The expected result is Negative.  Fact Sheet for Patients:  BloggerCourse.com  Fact Sheet for Healthcare Providers:  SeriousBroker.it  This test is no t yet approved or cleared by the [place] FDA and  has been authorized for detection and/or diagnosis of SARS-CoV-2 by FDA under an Emergency Use Authorization (EUA). This EUA will remain  in effect (meaning this test can be used) for the duration of the COVID-19 declaration under Section 564(b)(1) of the Act, 21 U.S.C.section 360bbb-3(b)(1), unless the authorization is terminated  or revoked sooner.        Influenza A by PCR NEGATIVE NEGATIVE   Influenza B by PCR NEGATIVE NEGATIVE    Comment: (NOTE) The Xpert Xpress SARS-CoV-2/FLU/RSV plus assay is intended as an  aid in the diagnosis of influenza from Nasopharyngeal swab specimens and should not be used as a sole basis for treatment. Nasal washings and aspirates are unacceptable for Xpert Xpress SARS-CoV-2/FLU/RSV testing.  Fact Sheet for Patients: BloggerCourse.com  Fact Sheet for Healthcare Providers: SeriousBroker.it  This test is not yet approved or cleared by the [place] FDA and has been authorized for detection and/or diagnosis of SARS-CoV-2 by FDA under an Emergency Use Authorization (EUA). This EUA will remain in effect (meaning this test can be used) for the duration of the COVID-19 declaration under Section 564(b)(1) of the Act, 21 U.S.C. section 360bbb-3(b)(1), unless the authorization is terminated or revoked.  Performed at [facility], [address] [place], [place] [address]   Comprehensive metabolic panel     Status: Abnormal   Collection Time: 02/14/21  9:00 PM  Result Value Ref Range   Sodium 137 135 - 145 mmol/L   Potassium 3.3 (L) 3.5 - 5.1 mmol/L   Chloride 99 98 - 111 mmol/L   CO2 29 22 - 32 mmol/L   Glucose, Bld 90 70 - 99 mg/dL    Comment: Glucose reference range applies only to samples taken after fasting for at least 8 hours.   BUN 26 (H) 8 - 23 mg/dL   Creatinine, Ser 4.96 (H) 0.61 - 1.24 mg/dL   Calcium 75.9 (H) 8.9 - 10.3 mg/dL   Total Protein 8.3 (H) 6.5 - 8.1 g/dL   Albumin 4.3 3.5 - 5.0 g/dL   AST 44 (H) 15 - 41 U/L   ALT 17 0 - 44 U/L   Alkaline Phosphatase 86 38 - 126 U/L   Total Bilirubin 1.0 0.3 - 1.2 mg/dL   GFR, Estimated 52 (L) >60 mL/min    Comment: (NOTE) Calculated using the CKD-EPI Creatinine Equation (2021)    Anion gap 9 5 - 15    Comment: Performed at [facility], [address] [place], [place] [address]  CBC      Status: Abnormal   Collection Time: 02/14/21  9:00 PM  Result Value Ref Range   WBC 6.3 4.0 - 10.5 K/uL   RBC 3.94 (L) 4.22 - 5.81 MIL/uL   Hemoglobin 12.0 (L) 13.0 - 17.0 g/dL   HCT 66.5 (L) 99.3 - 57.0 %   MCV 93.9 80.0 - 100.0 fL   MCH 30.5 26.0 - 34.0 pg   MCHC 32.4 30.0 - 36.0 g/dL   RDW 17.7 93.9 - 03.0 %   Platelets 180 150 - 400 K/uL   nRBC 0.0 0.0 - 0.2 %    Comment: Performed at [facility], [address] [place], [place] [address]  Lactic acid, plasma     Status: None   Collection Time: 02/14/21  9:01 PM  Result Value Ref Range   Lactic Acid, Venous 1.2 0.5 - 1.9 mmol/L    Comment: Performed at [facility], [address], [place], [place] [address]  TSH     Status: Abnormal   Collection Time: 02/14/21  9:01 PM  Result Value Ref Range   TSH 6.890 (H) 0.350 - 4.500 uIU/mL    Comment: Performed by a 3rd Generation assay with a functional sensitivity of <=0.01 uIU/mL. Performed at [facility], [address], [place], [place] [address]   Lactic acid, plasma     Status: None   Collection Time: 02/14/21 10:56 PM  Result Value Ref Range   Lactic Acid, Venous 1.6 0.5 - 1.9 mmol/L    Comment: Performed at  [facility], [address] [place], [place] [address]    Chemistries  Recent Labs  Lab 02/14/21 2100  NA 137  K 3.3*  CL 99  CO2 29  GLUCOSE 90  BUN 26*  CREATININE 1.34*  CALCIUM 10.9*  AST 44*  ALT 17  ALKPHOS 86  BILITOT 1.0   ------------------------------------------------------------------------------------------------------------------  ------------------------------------------------------------------------------------------------------------------ GFR: Estimated Creatinine Clearance: 36.5 mL/min (A) (by C-G formula based on SCr of 1.34 mg/dL (H)). Liver Function Tests: Recent Labs  Lab 02/14/21 2100  AST 44*  ALT 17  ALKPHOS 86  BILITOT 1.0  PROT 8.3*  ALBUMIN 4.3   No results for input(s): LIPASE, AMYLASE in the last 168 hours. No  results for input(s): AMMONIA in the last 168 hours. Coagulation Profile: No results for input(s): INR, PROTIME in the last 168 hours. Cardiac Enzymes: No results for input(s): CKTOTAL, CKMB, CKMBINDEX, TROPONINI in the last 168 hours. BNP (last 3 results) No results for input(s): PROBNP in the last 8760 hours. HbA1C: No results for input(s): HGBA1C in the last 72 hours. CBG: No results for input(s): GLUCAP in the last 168 hours. Lipid Profile: No results for input(s): CHOL, HDL, LDLCALC, TRIG, CHOLHDL, LDLDIRECT in the last 72 hours. Thyroid Function Tests: Recent Labs    02/14/21 2101  TSH 6.890*   Anemia Panel: No results for input(s): VITAMINB12, FOLATE, FERRITIN, TIBC, IRON, RETICCTPCT in the last 72 hours.  --------------------------------------------------------------------------------------------------------------- Urine analysis:    Component Value Date/Time   COLORURINE YELLOW (A) 12/30/2017 1150   APPEARANCEUR Cloudy (A) 01/17/2018 1422   LABSPEC 1.011 12/30/2017 1150   PHURINE 5.0 12/30/2017 1150   GLUCOSEU Negative 01/17/2018 1422   HGBUR NEGATIVE 12/30/2017 1150   BILIRUBINUR Negative 01/17/2018 1422   KETONESUR NEGATIVE 12/30/2017 1150   PROTEINUR 1+ (A) 01/17/2018 1422   PROTEINUR NEGATIVE 12/30/2017 1150   UROBILINOGEN 0.2 07/05/2014 1749   NITRITE Negative 01/17/2018 1422   NITRITE NEGATIVE 12/30/2017 1150   LEUKOCYTESUR 1+ (A) 01/17/2018 1422      Imaging Results:    CT Head Wo Contrast  Result Date: 02/14/2021 CLINICAL DATA:  Altered level of consciousness, change in mentation, urinary tract infection EXAM: CT HEAD WITHOUT CONTRAST TECHNIQUE: Contiguous axial images were obtained from the base of the skull through the vertex without intravenous contrast. COMPARISON:  12/02/2009 FINDINGS: Brain: Chronic lacunar infarcts are seen within the bilateral basal ganglia. No acute infarct or hemorrhage. Lateral ventricles and midline structures are  unremarkable. No acute extra-axial fluid collections. No mass effect. Vascular: Stable atherosclerosis.  No hyperdense vessel. Skull: Normal. Negative for fracture or focal lesion. Sinuses/Orbits: Partial opacification left frontal sinus. Remaining paranasal sinuses are clear. Other: None. IMPRESSION: 1. No acute intracranial process. 2. Chronic lacunar infarcts bilateral basal ganglia. 3. Left frontal sinus disease. Electronically Signed   By: Sharlet Salina M.D.   On: 02/14/2021 21:09   DG Chest Portable 1 View  Result Date: 02/14/2021 CLINICAL DATA:  Altered level of consciousness, urinary tract infection, change in mentation EXAM: PORTABLE CHEST 1 VIEW COMPARISON:  11/03/2018 FINDINGS: Single frontal view of the chest demonstrates stable dual lead pacemaker, with postsurgical changes from median sternotomy and aortic and mitral valve prostheses. Increased veiling opacity at the left lung base consistent with enlarging left pleural effusion and left basilar consolidation. Right chest is clear. No pneumothorax. No acute bony abnormalities. IMPRESSION: 1. Increased veiling opacity at the left lung base, consistent with left basilar consolidation and enlarging left pleural effusion. Electronically Signed   By: Casimiro Needle  Manson Passey M.D.   On: 02/14/2021 21:07       Assessment & Plan:    Principal Problem:   Acute metabolic encephalopathy Active Problems:   HLD (hyperlipidemia)   Essential (primary) hypertension   Pleural effusion, left   Thyroid disease   Hypokalemia   1. Acute metabolic encephalopathy 1. Confused and somnolent 2. CT head is without acute changes 3. TSH is elevated - adjust synthroid 4. Polypharmacy is likely contributing - hold cipro, UA pending, Urine culture pending 5. Replace electrolytes 6. UA pending, urine culture pending 7. No fever, or leukosytosis 2. Pleural effusion 1. Chronic 2. Appears to have increased density on today's image 3. ED started Rocephin and  Zithromax 4. Hold off on continued antibiotics until procal is resulted 5. No leukocytosis or known fever 6. Patient has chronic cough for which he has been following with pulmonology  3. HTN 1. Continue lisinopril and Metoprolol 4. Thyroid disease 1. TSH is elevated 2. Patient is on synthroid - so not checking T4 3. In the setting of unexplained altered mental status - increase synthroid from to 5. Hypokalemia 1. Replace, recheck 2. Check mag in the am 6. HLD 1. Continue statin 7.    DVT Prophylaxis-   heparin - SCDs   AM Labs Ordered, also please review Full Orders  Family Communication: Admission, patients condition and plan of care including tests being ordered have been discussed with the patient and daughter who indicate understanding and agree with the plan and Code Status.  Code Status: DNR  Admission status: Observation   The patient's presenting symptoms include altered mental status The worrisome physical exam findings include encephalopathic, incoherent mumbling The initial radiographic and laboratory data are worrisome because of TSH 6.890 The chronic co-morbidities include OSA, HTN, HLD       Time spent in minutes : 65   Lesieli Bresee B Zierle-Ghosh DO

## 2021-02-14 NOTE — H&P (Incomplete)
TRH H&P    Patient Demographics:    Glenn [name], is a 87 y.o. male  MRN: [id number]  DOB - 22-Nov-1933  Admit Date - 02/14/2021  Referring MD/NP/PA: Long  Outpatient Primary MD for the patient is Glenn Downer, NP  Patient coming from: Home  Chief complaint- altered mental status   HPI:    Glenn [name]  is a 87 y.o. male, with history of thyroid disease, HTN, HLD, mitral valve replacement, nad more presents to the ED with a chief complaint of altered mental status. Patient is completely disoriented, so he is not able to provide any history. Daughter, who    Review of systems:    In addition to the HPI above,  No Fever-chills, No Headache, No changes with Vision or hearing, No problems swallowing food or Liquids, No Chest pain, Cough or Shortness of Breath, No Abdominal pain, No Nausea or Vomiting, bowel movements are regular, No Blood in stool or Urine, No dysuria, No new skin rashes or bruises, No new joints pains-aches,  No new weakness, tingling, numbness in any extremity, No recent weight gain or loss, No polyuria, polydypsia or polyphagia, No significant Mental Stressors.  All other systems reviewed and are negative.    Past History of the following :    Past Medical History:  Diagnosis Date  . CAD (coronary artery disease)    a. 12/2006 CABG x 3: LIMA->LAD, VG->PDA, VG->OM.  Marland Kitchen Constipation   . Elevated PSA   . Enlarged prostate   . H/O aortic valve replacement    a. 12/2006 Tissue AVR @ time of CABG.  . H/O mitral valve replacement    a. 12/2006 MV repair @ time of CABG.  . High cholesterol   . Hypertension   . Pleural effusion, left 2009   Chronic  . Sick sinus syndrome (HCC)    a. 12/2006 s/p MDT dual chamber PPM.  . Thyroid disease       Past Surgical History:  Procedure Laterality Date  . AoV  replacement    . CORONARY ARTERY BYPASS GRAFT    . INTRAOCULAR  LENS INSERTION  2007  . MEDIAL PARTIAL KNEE REPLACEMENT  2011  . PACEMAKER INSERTION  2008  . TURP VAPORIZATION  2010      Social History:      Social History   Tobacco Use  . Smoking status: Former Smoker    Packs/day: 1.00    Years: 15.00    Pack years: 15.00    Types: Cigarettes    Quit date: 07/06/1967    Years since quitting: 53.6  . Smokeless tobacco: Never Used  . Tobacco comment: Quit over 38 years ago  Substance Use Topics  . Alcohol use: No    Alcohol/week: 0.0 standard drinks    Comment: Occasional use in the past.       Family History :     Family History  Problem Relation Age of Onset  . Heart attack Father   . Hypertension Father   . Stroke Mother   .  Cancer Neg Hx        Kidney, Bladder, Prostate   ***   Home Medications:   Prior to Admission medications   Medication Sig Start Date End Date Taking? Authorizing Provider  aspirin EC 325 MG tablet Take 325 mg by mouth at bedtime.    Yes [provider]  cephALEXin (KEFLEX) 500 MG capsule Take 1 capsule by mouth 2 (two) times daily. 02/10/21 02/17/21 Yes [provider]  ciprofloxacin (CIPRO) 250 MG tablet Take 250 mg by mouth 2 (two) times daily. 02/13/21  Yes [provider]  donepezil (ARICEPT) 5 MG tablet Take 5 mg by mouth at bedtime. 12/08/20 12/08/21 Yes [provider]  ferrous sulfate 325 (65 FE) MG tablet Take 325 mg by mouth daily with breakfast.   Yes [provider]  fluticasone (FLONASE) 50 MCG/ACT nasal spray Place 2 sprays into both nostrils daily as needed for allergies.  05/10/14  Yes [provider]  levothyroxine (SYNTHROID, LEVOTHROID) 88 MCG tablet Take 88 mcg by mouth daily.  07/02/14  Yes [provider]  lisinopril (PRINIVIL,ZESTRIL) 5 MG tablet Take 5 mg by mouth daily. 03/03/15  Yes [provider]  lovastatin (MEVACOR) 40 MG tablet Take 40 mg by mouth at bedtime. 09/22/15  Yes [provider]  metoprolol  succinate (TOPROL-XL) 50 MG 24 hr tablet Take 50 mg by mouth daily. 02/05/21  Yes [provider]  Multiple Vitamin (MULTI-VITAMINS) TABS Take 1 tablet by mouth daily.   Yes [provider]  omeprazole (PRILOSEC) 20 MG capsule Take 1 capsule by mouth daily. 02/05/21  Yes [provider]  cetirizine (ZYRTEC) 10 MG tablet TAKE 1 TABLET BY MOUTH ONCE DAILY. Patient not taking: No sig reported 06/11/16   [provider]  Inulin (FIBER CHOICE PO) Take 2 capsules by mouth daily. Patient not taking: No sig reported    [provider]  nitrofurantoin, macrocrystal-monohydrate, (MACROBID) 100 MG capsule Take 1 capsule (100 mg total) by mouth every 12 (twelve) hours. Patient not taking: No sig reported 01/17/18   Michiel CowboyMcGowan, Shannon A, PA-C  rivastigmine (EXELON) 1.5 MG capsule Take by mouth. Patient not taking: No sig reported 11/28/20 11/28/21  [provider]  tiotropium (SPIRIVA HANDIHALER) 18 MCG inhalation capsule INHALE 1 CAPSULE AS DIRECTED ONCE DAILY. Patient not taking: No sig reported 08/10/16   [provider]     Allergies:    No Known Allergies   Physical Exam:   Vitals  Blood pressure 136/65, pulse 64, temperature 98.9 F (37.2 C), temperature source Oral, resp. rate 17, height 5\' 3"  (1.6 m), weight 72 kg, SpO2 100 %.  1.  General:  ***  2. Psychiatric: ***  3. Neurologic: ***  4. HEENMT:  ***  5. Respiratory : ***  6. Cardiovascular : ***  7. Gastrointestinal:  ***  8. Skin:  ***  9.Musculoskeletal:  ***    Data Review:    CBC Recent Labs  Lab 02/14/21 2100  WBC 6.3  HGB 12.0*  HCT 37.0*  PLT 180  MCV 93.9  MCH 30.5  MCHC 32.4  RDW 12.4   ------------------------------------------------------------------------------------------------------------------  Results for orders placed or performed during the hospital encounter of 02/14/21 (from the past 48 hour(s))  Resp Panel by RT-PCR (Flu  A&B, Covid) Nasopharyngeal Swab     Status: None   Collection Time: 02/14/21  8:14 PM   Specimen: Nasopharyngeal Swab; Nasopharyngeal(NP) swabs in vial transport medium  Result Value Ref Range   SARS  Coronavirus 2 by RT PCR NEGATIVE NEGATIVE    Comment: (NOTE) SARS-CoV-2 target nucleic acids are NOT DETECTED.  The SARS-CoV-2 RNA is generally detectable in upper respiratory specimens during the acute phase of infection. The lowest concentration of SARS-CoV-2 viral copies this assay can detect is 138 copies/mL. A negative result does not preclude SARS-Cov-2 infection and should not be used as the sole basis for treatment or other patient management decisions. A negative result may occur with  improper specimen collection/handling, submission of specimen other than nasopharyngeal swab, presence of viral mutation(s) within the areas targeted by this assay, and inadequate number of viral copies(<138 copies/mL). A negative result must be combined with clinical observations, patient history, and epidemiological information. The expected result is Negative.  Fact Sheet for Patients:  BloggerCourse.com  Fact Sheet for Healthcare Providers:  SeriousBroker.it  This test is no t yet approved or cleared by the [place] FDA and  has been authorized for detection and/or diagnosis of SARS-CoV-2 by FDA under an Emergency Use Authorization (EUA). This EUA will remain  in effect (meaning this test can be used) for the duration of the COVID-19 declaration under Section 564(b)(1) of the Act, 21 U.S.C.section 360bbb-3(b)(1), unless the authorization is terminated  or revoked sooner.       Influenza A by PCR NEGATIVE NEGATIVE   Influenza B by PCR NEGATIVE NEGATIVE    Comment: (NOTE) The Xpert Xpress SARS-CoV-2/FLU/RSV plus assay is intended as an aid in the diagnosis of influenza from Nasopharyngeal swab specimens and should not be used as a  sole basis for treatment. Nasal washings and aspirates are unacceptable for Xpert Xpress SARS-CoV-2/FLU/RSV testing.  Fact Sheet for Patients: BloggerCourse.com  Fact Sheet for Healthcare Providers: SeriousBroker.it  This test is not yet approved or cleared by the [place] FDA and has been authorized for detection and/or diagnosis of SARS-CoV-2 by FDA under an Emergency Use Authorization (EUA). This EUA will remain in effect (meaning this test can be used) for the duration of the COVID-19 declaration under Section 564(b)(1) of the Act, 21 U.S.C. section 360bbb-3(b)(1), unless the authorization is terminated or revoked.  Performed at [facility], [address] [place], [place] [address]   Comprehensive metabolic panel     Status: Abnormal   Collection Time: 02/14/21  9:00 PM  Result Value Ref Range   Sodium 137 135 - 145 mmol/L   Potassium 3.3 (L) 3.5 - 5.1 mmol/L   Chloride 99 98 - 111 mmol/L   CO2 29 22 - 32 mmol/L   Glucose, Bld 90 70 - 99 mg/dL    Comment: Glucose reference range applies only to samples taken after fasting for at least 8 hours.   BUN 26 (H) 8 - 23 mg/dL   Creatinine, Ser 4.97 (H) 0.61 - 1.24 mg/dL   Calcium 02.6 (H) 8.9 - 10.3 mg/dL   Total Protein 8.3 (H) 6.5 - 8.1 g/dL   Albumin 4.3 3.5 - 5.0 g/dL   AST 44 (H) 15 - 41 U/L   ALT 17 0 - 44 U/L   Alkaline Phosphatase 86 38 - 126 U/L   Total Bilirubin 1.0 0.3 - 1.2 mg/dL   GFR, Estimated 52 (L) >60 mL/min    Comment: (NOTE) Calculated using the CKD-EPI Creatinine Equation (2021)    Anion gap 9 5 - 15    Comment: Performed at [facility], [address] [place], [place] [address]  CBC     Status: Abnormal   Collection Time: 02/14/21  9:00 PM  Result Value Ref Range   WBC 6.3 4.0 - 10.5 K/uL   RBC 3.94 (L) 4.22 - 5.81 MIL/uL   Hemoglobin 12.0 (L) 13.0 - 17.0 g/dL   HCT 26.3 (L) 33.5 - 45.6 %   MCV 93.9 80.0 - 100.0 fL   MCH 30.5 26.0 - 34.0 pg    MCHC 32.4 30.0 - 36.0 g/dL   RDW 25.6 38.9 - 37.3 %   Platelets 180 150 - 400 K/uL   nRBC 0.0 0.0 - 0.2 %    Comment: Performed at [facility], [address] [place], [place] [address]  Lactic acid, plasma     Status: None   Collection Time: 02/14/21  9:01 PM  Result Value Ref Range   Lactic Acid, Venous 1.2 0.5 - 1.9 mmol/L    Comment: Performed at [facility], [address], [place], [place] [address]  TSH     Status: Abnormal   Collection Time: 02/14/21  9:01 PM  Result Value Ref Range   TSH 6.890 (H) 0.350 - 4.500 uIU/mL    Comment: Performed by a 3rd Generation assay with a functional sensitivity of <=0.01 uIU/mL. Performed at [facility], [address] [place], [place] [address]   Lactic acid, plasma     Status: None   Collection Time: 02/14/21 10:56 PM  Result Value Ref Range   Lactic Acid, Venous 1.6 0.5 - 1.9 mmol/L    Comment: Performed at [facility], [address], [place], [place] [address]    Chemistries  Recent Labs  Lab 02/14/21 2100  NA 137  K 3.3*  CL 99  CO2 29  GLUCOSE 90  BUN 26*  CREATININE 1.34*  CALCIUM 10.9*  AST 44*  ALT 17  ALKPHOS 86  BILITOT 1.0   ------------------------------------------------------------------------------------------------------------------  ------------------------------------------------------------------------------------------------------------------ GFR: Estimated Creatinine Clearance: 36.5 mL/min (A) (by C-G formula based on SCr of 1.34 mg/dL (H)). Liver Function Tests: Recent Labs  Lab 02/14/21 2100  AST 44*  ALT 17  ALKPHOS 86  BILITOT 1.0  PROT 8.3*  ALBUMIN 4.3   No results for input(s): LIPASE, AMYLASE in the last 168 hours. No results for input(s): AMMONIA in the last 168 hours. Coagulation Profile: No results for input(s): INR, PROTIME in the last 168 hours. Cardiac Enzymes: No results for input(s): CKTOTAL, CKMB, CKMBINDEX, TROPONINI in the last 168 hours. BNP (last 3 results) No  results for input(s): PROBNP in the last 8760 hours. HbA1C: No results for input(s): HGBA1C in the last 72 hours. CBG: No results for input(s): GLUCAP in the last 168 hours. Lipid Profile: No results for input(s): CHOL, HDL, LDLCALC, TRIG, CHOLHDL, LDLDIRECT in the last 72 hours. Thyroid Function Tests: Recent Labs    02/14/21 2101  TSH 6.890*   Anemia Panel: No results for input(s): VITAMINB12, FOLATE, FERRITIN, TIBC, IRON, RETICCTPCT in the last 72 hours.  --------------------------------------------------------------------------------------------------------------- Urine analysis:    Component Value Date/Time   COLORURINE YELLOW (A) 12/30/2017 1150   APPEARANCEUR Cloudy (A) 01/17/2018 1422   LABSPEC 1.011 12/30/2017 1150   PHURINE 5.0 12/30/2017 1150   GLUCOSEU Negative 01/17/2018 1422   HGBUR NEGATIVE 12/30/2017 1150   BILIRUBINUR Negative 01/17/2018 1422   KETONESUR NEGATIVE 12/30/2017 1150   PROTEINUR 1+ (A) 01/17/2018 1422   PROTEINUR NEGATIVE 12/30/2017 1150   UROBILINOGEN 0.2 07/05/2014 1749   NITRITE Negative 01/17/2018 1422   NITRITE NEGATIVE 12/30/2017 1150   LEUKOCYTESUR 1+ (A) 01/17/2018 1422      Imaging Results:  CT Head Wo Contrast  Result Date: 02/14/2021 CLINICAL DATA:  Altered level of consciousness, change in mentation, urinary tract infection EXAM: CT HEAD WITHOUT CONTRAST TECHNIQUE: Contiguous axial images were obtained from the base of the skull through the vertex without intravenous contrast. COMPARISON:  12/02/2009 FINDINGS: Brain: Chronic lacunar infarcts are seen within the bilateral basal ganglia. No acute infarct or hemorrhage. Lateral ventricles and midline structures are unremarkable. No acute extra-axial fluid collections. No mass effect. Vascular: Stable atherosclerosis.  No hyperdense vessel. Skull: Normal. Negative for fracture or focal lesion. Sinuses/Orbits: Partial opacification left frontal sinus. Remaining paranasal sinuses are  clear. Other: None. IMPRESSION: 1. No acute intracranial process. 2. Chronic lacunar infarcts bilateral basal ganglia. 3. Left frontal sinus disease. Electronically Signed   By: Sharlet Salina M.D.   On: 02/14/2021 21:09   DG Chest Portable 1 View  Result Date: 02/14/2021 CLINICAL DATA:  Altered level of consciousness, urinary tract infection, change in mentation EXAM: PORTABLE CHEST 1 VIEW COMPARISON:  11/03/2018 FINDINGS: Single frontal view of the chest demonstrates stable dual lead pacemaker, with postsurgical changes from median sternotomy and aortic and mitral valve prostheses. Increased veiling opacity at the left lung base consistent with enlarging left pleural effusion and left basilar consolidation. Right chest is clear. No pneumothorax. No acute bony abnormalities. IMPRESSION: 1. Increased veiling opacity at the left lung base, consistent with left basilar consolidation and enlarging left pleural effusion. Electronically Signed   By: Sharlet Salina M.D.   On: 02/14/2021 21:07    My personal review of EKG: Rhythm NSR, Rate ** /min, QTc *** ,no Acute ST changes   Assessment & Plan:    Principal Problem:   Acute metabolic encephalopathy Active Problems:   HLD (hyperlipidemia)   Essential (primary) hypertension   Pleural effusion, left   Thyroid disease   Hypokalemia   1. Acute metabolic encephalopathy 1. Confused and somnolent 2. CT head is without acute changes 3. TSH is elevated - adjust synthroid 4. Polypharmacy is likely contributing - hold cipro, UA pending, Urine culture pending 5. Replace electrolytes 6. UA pending, urine culture pending 7. No fever, or leukosytosis 2. Pleural effusion 1. Chronic 2. Appears to have increased density on today's image 3. ED started Rocephin and Zithromax 4. Hold off on continued antibiotics until procal is resulted 5. No leukocytosis or known fever 6. Patient has chronic cough for which he has been following with pulmonology   3. HTN 1. Continue lisinopril and Metoprolol 4. Thyroid disease 1. TSH is elevated 2. Patient is on synthroid - so not checking T4 3. In the setting of unexplained altered mental status - increase synthroid from to 5. Hypokalemia 1. Replace, recheck 2. Check mag in the am 6. HLD 1. Continue statin 7.    DVT Prophylaxis-   heparin - SCDs   AM Labs Ordered, also please review Full Orders  Family Communication: Admission, patients condition and plan of care including tests being ordered have been discussed with the patient and daughter who indicate understanding and agree with the plan and Code Status.  Code Status: DNR  Admission status: Observation   The patient's presenting symptoms include altered mental status The worrisome physical exam findings include encephalopathic, incoherent mumbling The initial radiographic and laboratory data are worrisome because of TSH 6.890 The chronic co-morbidities include OSA, HTN, HLD       Time spent in minutes : 65   Princella Jaskiewicz B Zierle-Ghosh DO

## 2021-02-14 NOTE — ED Notes (Signed)
Patient transported to CT 

## 2021-02-15 ENCOUNTER — Other Ambulatory Visit: Payer: Self-pay

## 2021-02-15 DIAGNOSIS — Z952 Presence of prosthetic heart valve: Secondary | ICD-10-CM | POA: Diagnosis not present

## 2021-02-15 DIAGNOSIS — Z7982 Long term (current) use of aspirin: Secondary | ICD-10-CM | POA: Diagnosis not present

## 2021-02-15 DIAGNOSIS — R410 Disorientation, unspecified: Secondary | ICD-10-CM | POA: Diagnosis present

## 2021-02-15 DIAGNOSIS — R946 Abnormal results of thyroid function studies: Secondary | ICD-10-CM | POA: Diagnosis present

## 2021-02-15 DIAGNOSIS — Z85828 Personal history of other malignant neoplasm of skin: Secondary | ICD-10-CM | POA: Diagnosis not present

## 2021-02-15 DIAGNOSIS — F05 Delirium due to known physiological condition: Secondary | ICD-10-CM | POA: Diagnosis present

## 2021-02-15 DIAGNOSIS — I495 Sick sinus syndrome: Secondary | ICD-10-CM | POA: Diagnosis present

## 2021-02-15 DIAGNOSIS — E876 Hypokalemia: Secondary | ICD-10-CM | POA: Diagnosis present

## 2021-02-15 DIAGNOSIS — R053 Chronic cough: Secondary | ICD-10-CM | POA: Diagnosis present

## 2021-02-15 DIAGNOSIS — Z823 Family history of stroke: Secondary | ICD-10-CM | POA: Diagnosis not present

## 2021-02-15 DIAGNOSIS — F0390 Unspecified dementia without behavioral disturbance: Secondary | ICD-10-CM | POA: Diagnosis present

## 2021-02-15 DIAGNOSIS — Z79890 Hormone replacement therapy (postmenopausal): Secondary | ICD-10-CM | POA: Diagnosis not present

## 2021-02-15 DIAGNOSIS — I10 Essential (primary) hypertension: Secondary | ICD-10-CM | POA: Diagnosis present

## 2021-02-15 DIAGNOSIS — Z951 Presence of aortocoronary bypass graft: Secondary | ICD-10-CM | POA: Diagnosis not present

## 2021-02-15 DIAGNOSIS — J90 Pleural effusion, not elsewhere classified: Secondary | ICD-10-CM | POA: Diagnosis present

## 2021-02-15 DIAGNOSIS — E039 Hypothyroidism, unspecified: Secondary | ICD-10-CM | POA: Diagnosis present

## 2021-02-15 DIAGNOSIS — Z79899 Other long term (current) drug therapy: Secondary | ICD-10-CM | POA: Diagnosis not present

## 2021-02-15 DIAGNOSIS — G4733 Obstructive sleep apnea (adult) (pediatric): Secondary | ICD-10-CM | POA: Diagnosis present

## 2021-02-15 DIAGNOSIS — Z20822 Contact with and (suspected) exposure to covid-19: Secondary | ICD-10-CM | POA: Diagnosis present

## 2021-02-15 DIAGNOSIS — E785 Hyperlipidemia, unspecified: Secondary | ICD-10-CM | POA: Diagnosis present

## 2021-02-15 DIAGNOSIS — G9341 Metabolic encephalopathy: Secondary | ICD-10-CM | POA: Diagnosis present

## 2021-02-15 DIAGNOSIS — Z66 Do not resuscitate: Secondary | ICD-10-CM | POA: Diagnosis present

## 2021-02-15 DIAGNOSIS — Z950 Presence of cardiac pacemaker: Secondary | ICD-10-CM | POA: Diagnosis not present

## 2021-02-15 DIAGNOSIS — Z87891 Personal history of nicotine dependence: Secondary | ICD-10-CM | POA: Diagnosis not present

## 2021-02-15 DIAGNOSIS — Z8249 Family history of ischemic heart disease and other diseases of the circulatory system: Secondary | ICD-10-CM | POA: Diagnosis not present

## 2021-02-15 LAB — CBC WITH DIFFERENTIAL/PLATELET
Abs Immature Granulocytes: 0.03 10*3/uL (ref 0.00–0.07)
Basophils Absolute: 0 10*3/uL (ref 0.0–0.1)
Basophils Relative: 1 %
Eosinophils Absolute: 0 10*3/uL (ref 0.0–0.5)
Eosinophils Relative: 0 %
HCT: 39.8 % (ref 39.0–52.0)
Hemoglobin: 13.1 g/dL (ref 13.0–17.0)
Immature Granulocytes: 0 %
Lymphocytes Relative: 14 %
Lymphs Abs: 0.9 10*3/uL (ref 0.7–4.0)
MCH: 30.8 pg (ref 26.0–34.0)
MCHC: 32.9 g/dL (ref 30.0–36.0)
MCV: 93.6 fL (ref 80.0–100.0)
Monocytes Absolute: 0.6 10*3/uL (ref 0.1–1.0)
Monocytes Relative: 8 %
Neutro Abs: 5.4 10*3/uL (ref 1.7–7.7)
Neutrophils Relative %: 77 %
Platelets: 180 10*3/uL (ref 150–400)
RBC: 4.25 MIL/uL (ref 4.22–5.81)
RDW: 12.5 % (ref 11.5–15.5)
WBC: 6.9 10*3/uL (ref 4.0–10.5)
nRBC: 0 % (ref 0.0–0.2)

## 2021-02-15 LAB — URINALYSIS, ROUTINE W REFLEX MICROSCOPIC
Bacteria, UA: NONE SEEN
Bilirubin Urine: NEGATIVE
Glucose, UA: NEGATIVE mg/dL
Hgb urine dipstick: NEGATIVE
Ketones, ur: 5 mg/dL — AB
Leukocytes,Ua: NEGATIVE
Nitrite: NEGATIVE
Protein, ur: 30 mg/dL — AB
Specific Gravity, Urine: 1.018 (ref 1.005–1.030)
pH: 6 (ref 5.0–8.0)

## 2021-02-15 LAB — BLOOD GAS, VENOUS
Acid-Base Excess: 4.5 mmol/L — ABNORMAL HIGH (ref 0.0–2.0)
Bicarbonate: 26.7 mmol/L (ref 20.0–28.0)
FIO2: 21
O2 Saturation: 36.1 %
Patient temperature: 37
pCO2, Ven: 43.9 mmHg — ABNORMAL LOW (ref 44.0–60.0)
pH, Ven: 7.43 (ref 7.250–7.430)
pO2, Ven: <31 mmHg — CL (ref 32.0–45.0)

## 2021-02-15 LAB — COMPREHENSIVE METABOLIC PANEL
ALT: 14 U/L (ref 0–44)
AST: 35 U/L (ref 15–41)
Albumin: 3.5 g/dL (ref 3.5–5.0)
Alkaline Phosphatase: 70 U/L (ref 38–126)
Anion gap: 9 (ref 5–15)
BUN: 25 mg/dL — ABNORMAL HIGH (ref 8–23)
CO2: 28 mmol/L (ref 22–32)
Calcium: 10.1 mg/dL (ref 8.9–10.3)
Chloride: 105 mmol/L (ref 98–111)
Creatinine, Ser: 1.18 mg/dL (ref 0.61–1.24)
GFR, Estimated: >60 mL/min (ref >60)
Glucose, Bld: 87 mg/dL (ref 70–99)
Potassium: 3.4 mmol/L — ABNORMAL LOW (ref 3.5–5.1)
Sodium: 142 mmol/L (ref 135–145)
Total Bilirubin: 0.9 mg/dL (ref 0.3–1.2)
Total Protein: 6.8 g/dL (ref 6.5–8.1)

## 2021-02-15 LAB — GLUCOSE, CAPILLARY
Glucose-Capillary: 75 mg/dL (ref 70–99)
Glucose-Capillary: 90 mg/dL (ref 70–99)
Glucose-Capillary: 91 mg/dL (ref 70–99)

## 2021-02-15 LAB — MAGNESIUM: Magnesium: 1.9 mg/dL (ref 1.7–2.4)

## 2021-02-15 LAB — VITAMIN B12: Vitamin B-12: 388 pg/mL (ref 180–914)

## 2021-02-15 LAB — AMMONIA: Ammonia: <9 umol/L — ABNORMAL LOW (ref 9–35)

## 2021-02-15 LAB — FOLATE: Folate: 31.1 ng/mL (ref >5.9)

## 2021-02-15 LAB — HEMOGLOBIN A1C
Hgb A1c MFr Bld: 5.1 % (ref 4.8–5.6)
Mean Plasma Glucose: 99.67 mg/dL

## 2021-02-15 MED ORDER — ACETAMINOPHEN 325 MG PO TABS: 650.0000 mg | ORAL_TABLET | Freq: Four times a day (QID) | ORAL | Status: DC | PRN

## 2021-02-15 MED ORDER — INSULIN ASPART 100 UNIT/ML IJ SOLN: 0.0000 [IU] | INTRAMUSCULAR | Status: DC

## 2021-02-15 MED ORDER — LORAZEPAM 2 MG/ML IJ SOLN
1.0000 mg | INTRAMUSCULAR | Status: DC | PRN
  Administered 2021-02-15 – 2021-02-16 (×3): 1 mg via INTRAVENOUS
  Filled 2021-02-15 (×3): qty 1

## 2021-02-15 MED ORDER — ONDANSETRON HCL 4 MG/2ML IJ SOLN: 4.0000 mg | Freq: Four times a day (QID) | INTRAMUSCULAR | Status: DC | PRN

## 2021-02-15 MED ORDER — POTASSIUM CHLORIDE 20 MEQ PO PACK
40.0000 meq | PACK | Freq: Once | ORAL | Status: AC
  Administered 2021-02-15: 40 meq via ORAL
  Filled 2021-02-15: qty 2

## 2021-02-15 MED ORDER — ONDANSETRON HCL 4 MG PO TABS
4.0000 mg | ORAL_TABLET | Freq: Four times a day (QID) | ORAL | Status: DC | PRN
Start: 2021-02-14 — End: 2021-02-17

## 2021-02-15 MED ORDER — ASPIRIN EC 325 MG PO TBEC
325.0000 mg | DELAYED_RELEASE_TABLET | Freq: Every day | ORAL | Status: DC
  Administered 2021-02-16: 325 mg via ORAL
  Filled 2021-02-15: qty 1

## 2021-02-15 MED ORDER — LEVOTHYROXINE SODIUM 100 MCG/5ML IV SOLN
50.0000 ug | Freq: Every day | INTRAVENOUS | Status: DC
  Administered 2021-02-16 – 2021-02-17 (×2): 50 ug via INTRAVENOUS
  Filled 2021-02-15 (×2): qty 5

## 2021-02-15 MED ORDER — PRAVASTATIN SODIUM 40 MG PO TABS
40.0000 mg | ORAL_TABLET | Freq: Every day | ORAL | Status: DC
  Filled 2021-02-15 (×2): qty 1

## 2021-02-15 MED ORDER — LEVOTHYROXINE SODIUM 100 MCG PO TABS
100.0000 ug | ORAL_TABLET | Freq: Every day | ORAL | Status: DC
  Administered 2021-02-15: 100 ug via ORAL
  Filled 2021-02-15: qty 1

## 2021-02-15 MED ORDER — LACTATED RINGERS IV SOLN: INTRAVENOUS | Status: DC

## 2021-02-15 MED ORDER — LABETALOL HCL 5 MG/ML IV SOLN: 10.0000 mg | INTRAVENOUS | Status: DC | PRN

## 2021-02-15 MED ORDER — OXYCODONE HCL 5 MG PO TABS
5.0000 mg | ORAL_TABLET | ORAL | Status: DC | PRN
Start: 2021-02-15 — End: 2021-02-17

## 2021-02-15 MED ORDER — LISINOPRIL 5 MG PO TABS
5.0000 mg | ORAL_TABLET | Freq: Every day | ORAL | Status: DC
  Administered 2021-02-15 – 2021-02-17 (×2): 5 mg via ORAL
  Filled 2021-02-15 (×2): qty 1

## 2021-02-15 MED ORDER — HEPARIN SODIUM (PORCINE) 5000 UNIT/ML IJ SOLN
5000.0000 [IU] | Freq: Three times a day (TID) | INTRAMUSCULAR | Status: DC
  Administered 2021-02-15 – 2021-02-17 (×7): 5000 [IU] via SUBCUTANEOUS
  Filled 2021-02-15 (×7): qty 1

## 2021-02-15 MED ORDER — DONEPEZIL HCL 5 MG PO TABS
5.0000 mg | ORAL_TABLET | Freq: Every day | ORAL | Status: DC
  Administered 2021-02-16: 5 mg via ORAL
  Filled 2021-02-15: qty 1

## 2021-02-15 MED ORDER — ACETAMINOPHEN 650 MG RE SUPP: 650.0000 mg | Freq: Four times a day (QID) | RECTAL | Status: DC | PRN

## 2021-02-15 MED ORDER — RIVASTIGMINE TARTRATE 1.5 MG PO CAPS: 1.5000 mg | ORAL_CAPSULE | Freq: Two times a day (BID) | ORAL | Status: DC

## 2021-02-15 MED ORDER — POTASSIUM CHLORIDE 10 MEQ/100ML IV SOLN
10.0000 meq | INTRAVENOUS | Status: AC
  Administered 2021-02-15 (×4): 10 meq via INTRAVENOUS
  Filled 2021-02-15 (×4): qty 100

## 2021-02-15 MED ORDER — METOPROLOL SUCCINATE ER 50 MG PO TB24
50.0000 mg | ORAL_TABLET | Freq: Every day | ORAL | Status: DC
  Administered 2021-02-15 – 2021-02-17 (×2): 50 mg via ORAL
  Filled 2021-02-15 (×2): qty 1

## 2021-02-15 NOTE — Progress Notes (Signed)
Date and time results received: 02/15/21 0900 (use smartphrase ".now" to insert current time)  Test: Po2 venus  Critical Value: <31  Name of Provider Notified: Maurilio Lovely DO In person   Orders Received? Or Actions Taken?: No new orders

## 2021-02-15 NOTE — Progress Notes (Signed)
PROGRESS NOTE    Glenn [name]  JKK:[id number] DOB: [birth year] DOA: 02/14/2021 PCP: Erasmo Downer, NP   Brief Narrative:   Glenn [name]  is a 87 y.o. male, with history of thyroid disease, HTN, HLD, mitral valve replacement, nad more presents to the ED with a chief complaint of altered mental status.  Patient is also having more difficulty with ambulation and does appear to have a history of early dementia.  He was admitted with acute metabolic encephalopathy versus progression of dementia.  Thus far, imaging studies did not demonstrate any acute findings and no significant lab abnormalities are noted.  Will likely require SNF/long-term placement.  Assessment & Plan:   Principal Problem:   Acute metabolic encephalopathy Active Problems:   HLD (hyperlipidemia)   Essential (primary) hypertension   Pleural effusion, left   Thyroid disease   Hypokalemia   Acute encephalopathy-multifactorial versus progression of dementia -CT head with no acute changes -TSH elevated and Synthroid adjusted -Will need SLP evaluation due to dysphagia -Polypharmacy likely a component -Plan to obtain Brain MRI by am and possible Neurology evaluation -No sign of infection noted, abx discontinued  Chronic pleural effusion -No sign of infection and procalcitonin low -Hold antibiotics -Currently NPO until SLP evaluation due to suspicion of aspiration  Hypertension -Home bp medications -Labetalol prn for bp elevations  Hypothyroidism -TSH elevated and therefore increased dose to -Now on IV form due to poor PO intake  Hypokalemia -Replete IV and recheck in am  Dyslipidemia -Continue statin   DVT prophylaxis:Heparin Code Status: DNR Family Communication: Discussed with daughter at bedside Disposition Plan:  Status is: Observation  The patient will require care spanning > 2 midnights and should be moved to inpatient because: Altered mental status, IV treatments appropriate  due to intensity of illness or inability to take PO and Inpatient level of care appropriate due to severity of illness  Dispo: The patient is from: Home              Anticipated d/c is to: SNF              Patient currently is not medically stable to d/c.   Difficult to place patient No   Consultants:   None  Procedures:   See below  Antimicrobials:  Anti-infectives (From admission, onward)   Start     Dose/Rate Route Frequency Ordered Stop   02/14/21 2245  cefTRIAXone (ROCEPHIN) 1 g in sodium chloride 0.9 % 100 mL IVPB        1 g 200 mL/hr over 30 Minutes Intravenous  Once 02/14/21 2239 02/15/21 0027   02/14/21 2245  azithromycin (ZITHROMAX) 500 mg in sodium chloride 0.9 % 250 mL IVPB  Status:  Discontinued        500 mg 250 mL/hr over 60 Minutes Intravenous Every 24 hours 02/14/21 2239 02/15/21 0657       Subjective: Patient seen and evaluated today with ongoing altered mentation.  Daughter is at bedside.  No other acute events noted since admission.  He appears to have trouble swallowing and was choking on any sips of fluids.  Objective: Vitals:   02/15/21 0000 02/15/21 0030 02/15/21 0133 02/15/21 0506  BP: (!) 156/74 (!) 143/111 (!) 152/83 (!) 138/59  Pulse: 64 64 66 69  Resp: 19 (!) 26 18 19   Temp:   98.2 F (36.8 C) 97.8 F (36.6 C)  TempSrc:      SpO2: 98% 99% 98% 99%  Weight:  Height:        Intake/Output Summary (Last 24 hours) at 02/15/2021 1204 Last data filed at 02/15/2021 0900 Gross per 24 hour  Intake 1091.77 ml  Output 100 ml  Net 991.77 ml   Filed Weights   02/14/21 1956  Weight: 72 kg    Examination:  General exam: Appears calm and comfortable, dressing on nose clean dry and intact; confused Respiratory system: Clear to auscultation. Respiratory effort normal. Cardiovascular system: S1 & S2 heard, RRR.  Gastrointestinal system: Abdomen is soft Central nervous system: Alert and awake Extremities: No edema Skin: No significant lesions  noted Psychiatry: Flat affect.    Data Reviewed: I have personally reviewed following labs and imaging studies  CBC: Recent Labs  Lab 02/14/21 2100 02/15/21 0538  WBC 6.3 6.9  NEUTROABS  --  5.4  HGB 12.0* 13.1  HCT 37.0* 39.8  MCV 93.9 93.6  PLT 180 180   Basic Metabolic Panel: Recent Labs  Lab 02/14/21 2100 02/15/21 0538  NA 137 142  K 3.3* 3.4*  CL 99 105  CO2 29 28  GLUCOSE 90 87  BUN 26* 25*  CREATININE 1.34* 1.18  CALCIUM 10.9* 10.1  MG  --  1.9   GFR: Estimated Creatinine Clearance: 41.5 mL/min (by C-G formula based on SCr of 1.18 mg/dL). Liver Function Tests: Recent Labs  Lab 02/14/21 2100 02/15/21 0538  AST 44* 35  ALT 17 14  ALKPHOS 86 70  BILITOT 1.0 0.9  PROT 8.3* 6.8  ALBUMIN 4.3 3.5   No results for input(s): LIPASE, AMYLASE in the last 168 hours. Recent Labs  Lab 02/15/21 0716  AMMONIA <9*   Coagulation Profile: No results for input(s): INR, PROTIME in the last 168 hours. Cardiac Enzymes: No results for input(s): CKTOTAL, CKMB, CKMBINDEX, TROPONINI in the last 168 hours. BNP (last 3 results) No results for input(s): PROBNP in the last 8760 hours. HbA1C: No results for input(s): HGBA1C in the last 72 hours. CBG: Recent Labs  Lab 02/15/21 0741  GLUCAP 75   Lipid Profile: No results for input(s): CHOL, HDL, LDLCALC, TRIG, CHOLHDL, LDLDIRECT in the last 72 hours. Thyroid Function Tests: Recent Labs    02/14/21 2101  TSH 6.890*   Anemia Panel: Recent Labs    02/15/21 0716  VITAMINB12 388  FOLATE 31.1   Sepsis Labs: Recent Labs  Lab 02/14/21 2100 02/14/21 2101 02/14/21 2256  PROCALCITON <0.10  --   --   LATICACIDVEN  --  1.2 1.6    Recent Results (from the past 240 hour(s))  Resp Panel by RT-PCR (Flu A&B, Covid) Nasopharyngeal Swab     Status: None   Collection Time: 02/14/21  8:14 PM   Specimen: Nasopharyngeal Swab; Nasopharyngeal(NP) swabs in vial transport medium  Result Value Ref Range Status   SARS  Coronavirus 2 by RT PCR NEGATIVE NEGATIVE Final    Comment: (NOTE) SARS-CoV-2 target nucleic acids are NOT DETECTED.  The SARS-CoV-2 RNA is generally detectable in upper respiratory specimens during the acute phase of infection. The lowest concentration of SARS-CoV-2 viral copies this assay can detect is 138 copies/mL. A negative result does not preclude SARS-Cov-2 infection and should not be used as the sole basis for treatment or other patient management decisions. A negative result may occur with  improper specimen collection/handling, submission of specimen other than nasopharyngeal swab, presence of viral mutation(s) within the areas targeted by this assay, and inadequate number of viral copies(<138 copies/mL). A negative result must be combined with clinical  observations, patient history, and epidemiological information. The expected result is Negative.  Fact Sheet for Patients:  BloggerCourse.com  Fact Sheet for Healthcare Providers:  SeriousBroker.it  This test is no t yet approved or cleared by the [place] FDA and  has been authorized for detection and/or diagnosis of SARS-CoV-2 by FDA under an Emergency Use Authorization (EUA). This EUA will remain  in effect (meaning this test can be used) for the duration of the COVID-19 declaration under Section 564(b)(1) of the Act, 21 U.S.C.section 360bbb-3(b)(1), unless the authorization is terminated  or revoked sooner.       Influenza A by PCR NEGATIVE NEGATIVE Final   Influenza B by PCR NEGATIVE NEGATIVE Final    Comment: (NOTE) The Xpert Xpress SARS-CoV-2/FLU/RSV plus assay is intended as an aid in the diagnosis of influenza from Nasopharyngeal swab specimens and should not be used as a sole basis for treatment. Nasal washings and aspirates are unacceptable for Xpert Xpress SARS-CoV-2/FLU/RSV testing.  Fact Sheet for  Patients: BloggerCourse.com  Fact Sheet for Healthcare Providers: SeriousBroker.it  This test is not yet approved or cleared by the [place] FDA and has been authorized for detection and/or diagnosis of SARS-CoV-2 by FDA under an Emergency Use Authorization (EUA). This EUA will remain in effect (meaning this test can be used) for the duration of the COVID-19 declaration under Section 564(b)(1) of the Act, 21 U.S.C. section 360bbb-3(b)(1), unless the authorization is terminated or revoked.  Performed at [facility], [address] [place], [place] [address]          Radiology Studies: CT Head Wo Contrast  Result Date: 02/14/2021 CLINICAL DATA:  Altered level of consciousness, change in mentation, urinary tract infection EXAM: CT HEAD WITHOUT CONTRAST TECHNIQUE: Contiguous axial images were obtained from the base of the skull through the vertex without intravenous contrast. COMPARISON:  12/02/2009 FINDINGS: Brain: Chronic lacunar infarcts are seen within the bilateral basal ganglia. No acute infarct or hemorrhage. Lateral ventricles and midline structures are unremarkable. No acute extra-axial fluid collections. No mass effect. Vascular: Stable atherosclerosis.  No hyperdense vessel. Skull: Normal. Negative for fracture or focal lesion. Sinuses/Orbits: Partial opacification left frontal sinus. Remaining paranasal sinuses are clear. Other: None. IMPRESSION: 1. No acute intracranial process. 2. Chronic lacunar infarcts bilateral basal ganglia. 3. Left frontal sinus disease. Electronically Signed   By: Sharlet Salina M.D.   On: 02/14/2021 21:09   DG Chest Portable 1 View  Result Date: 02/14/2021 CLINICAL DATA:  Altered level of consciousness, urinary tract infection, change in mentation EXAM: PORTABLE CHEST 1 VIEW COMPARISON:  11/03/2018 FINDINGS: Single frontal view of the chest demonstrates stable dual lead pacemaker, with postsurgical  changes from median sternotomy and aortic and mitral valve prostheses. Increased veiling opacity at the left lung base consistent with enlarging left pleural effusion and left basilar consolidation. Right chest is clear. No pneumothorax. No acute bony abnormalities. IMPRESSION: 1. Increased veiling opacity at the left lung base, consistent with left basilar consolidation and enlarging left pleural effusion. Electronically Signed   By: Sharlet Salina M.D.   On: 02/14/2021 21:07        Scheduled Meds: . aspirin EC  325 mg Oral QHS  . donepezil  5 mg Oral QHS  . heparin  5,000 Units Subcutaneous Q8H  . [START ON 02/16/2021] levothyroxine  50 mcg Intravenous Daily  . lisinopril  5 mg Oral Daily  . metoprolol succinate  50 mg Oral Daily  . pravastatin  40 mg Oral q1800  Continuous Infusions: . potassium chloride       LOS: 0 days    Time spent: 35 minutes    Monserat Prestigiacomo Hoover Brunette, DO [facility]  If 7PM-7AM, please contact night-coverage [email or web address] 02/15/2021, 12:04 PM

## 2021-02-16 ENCOUNTER — Inpatient Hospital Stay (HOSPITAL_COMMUNITY): Payer: Medicare Other

## 2021-02-16 ENCOUNTER — Encounter (HOSPITAL_COMMUNITY): Payer: Self-pay | Admitting: Family Medicine

## 2021-02-16 DIAGNOSIS — Z7189 Other specified counseling: Secondary | ICD-10-CM

## 2021-02-16 DIAGNOSIS — Z515 Encounter for palliative care: Secondary | ICD-10-CM

## 2021-02-16 DIAGNOSIS — G9341 Metabolic encephalopathy: Principal | ICD-10-CM

## 2021-02-16 LAB — BASIC METABOLIC PANEL
Anion gap: 8 (ref 5–15)
BUN: 27 mg/dL — ABNORMAL HIGH (ref 8–23)
CO2: 25 mmol/L (ref 22–32)
Calcium: 9.6 mg/dL (ref 8.9–10.3)
Chloride: 105 mmol/L (ref 98–111)
Creatinine, Ser: 1.26 mg/dL — ABNORMAL HIGH (ref 0.61–1.24)
GFR, Estimated: 56 mL/min — ABNORMAL LOW (ref >60)
Glucose, Bld: 204 mg/dL — ABNORMAL HIGH (ref 70–99)
Potassium: 3.1 mmol/L — ABNORMAL LOW (ref 3.5–5.1)
Sodium: 138 mmol/L (ref 135–145)

## 2021-02-16 LAB — GLUCOSE, CAPILLARY
Glucose-Capillary: 108 mg/dL — ABNORMAL HIGH (ref 70–99)
Glucose-Capillary: 173 mg/dL — ABNORMAL HIGH (ref 70–99)
Glucose-Capillary: 70 mg/dL (ref 70–99)
Glucose-Capillary: 71 mg/dL (ref 70–99)
Glucose-Capillary: 74 mg/dL (ref 70–99)
Glucose-Capillary: 74 mg/dL (ref 70–99)
Glucose-Capillary: 91 mg/dL (ref 70–99)
Glucose-Capillary: 94 mg/dL (ref 70–99)

## 2021-02-16 LAB — CBC
HCT: 35.8 % — ABNORMAL LOW (ref 39.0–52.0)
Hemoglobin: 11.4 g/dL — ABNORMAL LOW (ref 13.0–17.0)
MCH: 30.1 pg (ref 26.0–34.0)
MCHC: 31.8 g/dL (ref 30.0–36.0)
MCV: 94.5 fL (ref 80.0–100.0)
Platelets: 155 10*3/uL (ref 150–400)
RBC: 3.79 MIL/uL — ABNORMAL LOW (ref 4.22–5.81)
RDW: 12.4 % (ref 11.5–15.5)
WBC: 5 10*3/uL (ref 4.0–10.5)
nRBC: 0 % (ref 0.0–0.2)

## 2021-02-16 LAB — MAGNESIUM: Magnesium: 1.8 mg/dL (ref 1.7–2.4)

## 2021-02-16 MED ORDER — POTASSIUM CHLORIDE 10 MEQ/100ML IV SOLN
10.0000 meq | INTRAVENOUS | Status: AC
  Administered 2021-02-16 (×4): 10 meq via INTRAVENOUS
  Filled 2021-02-16 (×5): qty 100

## 2021-02-16 MED ORDER — DEXTROSE 50 % IV SOLN
1.0000 | Freq: Once | INTRAVENOUS | Status: AC
  Administered 2021-02-16: 50 mL via INTRAVENOUS
  Filled 2021-02-16: qty 50

## 2021-02-16 MED ORDER — DEXTROSE IN LACTATED RINGERS 5 % IV SOLN: INTRAVENOUS | Status: DC

## 2021-02-16 NOTE — TOC Initial Note (Signed)
Transition of Care [facility]) - Initial/Assessment Note    Patient Details  Name: Glenn [name] MRN: [id number] Date of Birth: 1934/05/18  [facility]) CM/SW Contact:    Glenn Cassis, LCSW Phone Number: 02/16/2021, 11:20 AM  Clinical Narrative:  Pt admitted due to acute metabolic encephalopathy. LCSW spoke with pt's daughter, Glenn [name] who reports she is HCPOA. She states that following discussion with MD this morning, she has decided to take pt home with hospice. Discussed hospice options in [facility] and she requests [facility]. Referral made. Pt will [facility] home and will have around the clock care by family. Pt will need hospital bed and overbed table. [facility] notified. Anticipate d/c tomorrow. TOC will continue to follow.                  Expected Discharge Plan: Home w Hospice Care Barriers to Discharge: Continued Medical Work up   Patient Goals and CMS Choice Patient states their goals for this hospitalization and ongoing recovery are:: return home with hospice   Choice offered to / list presented to : Adult Children  Expected Discharge Plan and Services Expected Discharge Plan: Home w Hospice Care In-house Referral: Clinical Social Work,Hospice / Palliative Care   Post Acute Care Choice: Hospice                   DME Arranged: Hospital bed,Overbed table DME Agency: Other - Comment Glenn [name]) Date DME Agency Contacted: 02/16/21 Time DME Agency Contacted: [id number] Representative spoke with at DME Agency: Glenn [name] and [place]   [facility] Agency: Other - See comment Glenn [name])        Prior Living Arrangements/Services     Patient language and need for interpreter reviewed:: Yes Do you feel safe going back to the place where you live?: Yes      Need for Family Participation in Patient Care: Yes (Comment) Care giver support system in place?: Yes (comment)   Criminal Activity/Legal Involvement Pertinent to Current  Situation/Hospitalization: No - Comment as needed  Activities of Daily Living Home Assistive Devices/Equipment: Blood pressure cuff,Cane (specify quad or straight),Walker (specify type) ADL Screening (condition at time of admission) Patient's cognitive ability adequate to safely complete daily activities?: Yes Is the patient deaf or have difficulty hearing?: Yes Does the patient have difficulty seeing, even when wearing glasses/contacts?: No Does the patient have difficulty concentrating, remembering, or making decisions?: Yes Patient able to express need for assistance with ADLs?: No Does the patient have difficulty dressing or bathing?: Yes Independently performs ADLs?: No Does the patient have difficulty walking or climbing stairs?: Yes Weakness of Legs: Both Weakness of Arms/Hands: None  Permission Sought/Granted         Permission granted to share info w AGENCY: [facility]  Permission granted to share info w Relationship: Hospice     Emotional Assessment   Attitude/Demeanor/Rapport: Unable to Assess Affect (typically observed): Unable to Assess   Alcohol / Substance Use: Not Applicable Psych Involvement: No (comment)  Admission diagnosis:  Disorientation [R41.0] Acute metabolic encephalopathy [G93.41] Community acquired pneumonia of left lower lobe of lung [J18.9] Patient Active Problem List   Diagnosis Date Noted  . Acute metabolic encephalopathy 02/14/2021  . Thyroid disease 02/14/2021  . Hypokalemia 02/14/2021  . Pain in limb 11/28/2018  . PAD (peripheral artery disease) (HCC) 11/28/2018  . Bradycardia 12/04/2017  . Persistent severe sinus bradycardia 12/04/2017  . History of elevated PSA 09/28/2015  . BPH with obstruction/lower urinary tract symptoms 03/30/2015  .  CAFL (chronic airflow limitation) (HCC) 03/25/2015  . Aortic valve defect 07/05/2014  . Asthma with chronic obstructive pulmonary disease (COPD) (HCC) 07/05/2014  . CHF with unknown LVEF (HCC)  07/05/2014  . CAD (coronary artery disease) 07/05/2014  . HLD (hyperlipidemia) 07/05/2014  . Hypertensive urgency 07/05/2014  . Anemia, iron deficiency 07/05/2014  . Disorder of mitral valve 07/05/2014  . Arthritis, degenerative 07/05/2014  . Sick sinus syndrome (HCC) 07/05/2014  . CKD (chronic kidney disease) stage 3, GFR 30-59 ml/min (HCC) 07/05/2014  . Chronic kidney disease (CKD), stage III (moderate) (HCC) 07/05/2014  . CCF (congestive cardiac failure) (HCC) 07/05/2014  . Atherosclerosis of coronary artery 07/05/2014  . Essential (primary) hypertension 07/05/2014  . BPH (benign prostatic hypertrophy) 05/03/2014  . Benign prostatic hypertrophy without urinary obstruction 05/03/2014  . Pleural effusion, left 10/19/2007   PCP:  Erasmo Downer, NP Pharmacy:   [facility] [address] [place] - [address] [place] [address] Phone: [phone] Fax: [phone]  [facility] [place] - [place], [place] - [address] [place] [address] Phone: [phone] Fax: [phone]     Social Determinants of Health (SDOH) Interventions    Readmission Risk Interventions No flowsheet data found.

## 2021-02-16 NOTE — Plan of Care (Signed)
  Problem: Acute Rehab PT Goals(only PT should resolve) Goal: Pt will Roll Supine to Side Outcome: Progressing Flowsheets (Taken 02/16/2021 1415) Pt will Roll Supine to Side: with mod assist Goal: Pt Will Go Supine/Side To Sit Outcome: Progressing Flowsheets (Taken 02/16/2021 1415) Pt will go Supine/Side to Sit:  with moderate assist  with HOB elevated Goal: Pt Will Go Sit To Supine/Side Outcome: Progressing Flowsheets (Taken 02/16/2021 1415) Pt will go Sit to Supine/Side:  with moderate assist  with HOB  elevated Goal: Patient Will Perform Sitting Balance Outcome: Progressing Flowsheets (Taken 02/16/2021 1415) Patient will perform sitting balance:  1-2 min  with maximum assist  with unilateral UE support Goal: Patient Will Transfer Sit To/From Stand Outcome: Progressing Flowsheets (Taken 02/16/2021 1415) Patient will transfer sit to/from stand: with maximum assist   Britta Mccreedy D. Hartnett-Rands, MS, PT [facility] [phone] 02/16/2021

## 2021-02-16 NOTE — Progress Notes (Addendum)
PROGRESS NOTE    RICKEY SADOWSKI  LKT:[id number] DOB: 1934/06/17 DOA: 02/14/2021 PCP: Erasmo Downer, NP   Brief Narrative:   [name] a86 y.o.male,with history of thyroid disease, HTN, HLD, mitral valve replacement, nad more presents to the ED with a chief complaint of altered mental status.  Patient is also having more difficulty with ambulation and does appear to have a history of early dementia.  He was admitted with acute metabolic encephalopathy versus progression of dementia.  Thus far, imaging studies did not demonstrate any acute findings and no significant lab abnormalities are noted. Plan is for dc to home with home hospice once equipment arrives.  Assessment & Plan:   Principal Problem:   Acute metabolic encephalopathy Active Problems:   HLD (hyperlipidemia)   Essential (primary) hypertension   Pleural effusion, left   Thyroid disease   Hypokalemia   Acute encephalopathy-likely progression of dementia -CT head with no acute changes -TSH elevated and Synthroid adjusted -Will need SLP evaluation due to dysphagia -Polypharmacy likely a component -Brain MRI could not be obtained due to pacemaker -No sign of infection noted, abx discontinued -Daughter agreeable to home with hospice as prognosis is poor -Palliative care consulted  Hypoglycemia -Noted overnight, now on D5 fluid  Chronic pleural effusion -No sign of infection and procalcitonin low -Hold antibiotics -Currently NPO until SLP evaluation due to suspicion of aspiration  Hypertension -Home bp medications -Labetalol prn for bp elevations  Hypothyroidism -TSH elevated and therefore increased dose to -Now on IV form due to poor PO intake  Hypokalemia -Replete IV and recheck in am  Dyslipidemia -Continue statin   DVT prophylaxis:Heparin Code Status: DNR Family Communication: Discussed with daughter at bedside 5/2 Disposition Plan:  Status is:  Inpatient  Remains inpatient appropriate because:Altered mental status and Unsafe d/c plan   Dispo: The patient is from: Home              Anticipated d/c is to: Home              Patient currently is medically stable to d/c. Awaiting equipment delivery for home hospice, likely am.   Difficult to place patient No   Consultants:   Palliative   Procedures:   See below  Antimicrobials:  Anti-infectives (From admission, onward)   Start     Dose/Rate Route Frequency Ordered Stop   02/14/21 2245  cefTRIAXone (ROCEPHIN) 1 g in sodium chloride 0.9 % 100 mL IVPB        1 g 200 mL/hr over 30 Minutes Intravenous  Once 02/14/21 2239 02/15/21 0027   02/14/21 2245  azithromycin (ZITHROMAX) 500 mg in sodium chloride 0.9 % 250 mL IVPB  Status:  Discontinued        500 mg 250 mL/hr over 60 Minutes Intravenous Every 24 hours 02/14/21 2239 02/15/21 0657       Subjective: Patient seen and evaluated today with ongoing lethargy and unresponsiveness. Daughter at bedside. Noted to be hypoglycemic overnight.  Objective: Vitals:   02/15/21 0506 02/15/21 1536 02/15/21 1957 02/16/21 0528  BP: (!) 138/59 (!) 174/70 (!) 122/56 (!) 114/57  Pulse: 69 69 69 63  Resp: 19 17 18 18   Temp: 97.8 F (36.6 C) 98.2 F (36.8 C) 98 F (36.7 C) 98.1 F (36.7 C)  TempSrc:  Oral    SpO2: 99% 96% 98% 99%  Weight:      Height:        Intake/Output Summary (Last 24 hours) at 02/16/2021 1214 Last data  filed at 02/16/2021 0900 Gross per 24 hour  Intake 0 ml  Output 400 ml  Net -400 ml   Filed Weights   02/14/21 1956  Weight: 72 kg    Examination:  General exam: Appears unresponsive Respiratory system: Clear to auscultation. Respiratory effort normal. Cardiovascular system: S1 & S2 heard, RRR.  Gastrointestinal system: Abdomen is soft Central nervous system: Unresponsive; lethargic Extremities: No edema Skin: No significant lesions noted, dressings on nose c/d/i Psychiatry: Flat  affect.    Data Reviewed: I have personally reviewed following labs and imaging studies  CBC: Recent Labs  Lab 02/14/21 2100 02/15/21 0538 02/16/21 0511  WBC 6.3 6.9 5.0  NEUTROABS  --  5.4  --   HGB 12.0* 13.1 11.4*  HCT 37.0* 39.8 35.8*  MCV 93.9 93.6 94.5  PLT 180 180 155   Basic Metabolic Panel: Recent Labs  Lab 02/14/21 2100 02/15/21 0538 02/16/21 0511  NA 137 142 138  K 3.3* 3.4* 3.1*  CL 99 105 105  CO2 29 28 25   GLUCOSE 90 87 204*  BUN 26* 25* 27*  CREATININE 1.34* 1.18 1.26*  CALCIUM 10.9* 10.1 9.6  MG  --  1.9 1.8   GFR: Estimated Creatinine Clearance: 38.8 mL/min (A) (by C-G formula based on SCr of 1.26 mg/dL (H)). Liver Function Tests: Recent Labs  Lab 02/14/21 2100 02/15/21 0538  AST 44* 35  ALT 17 14  ALKPHOS 86 70  BILITOT 1.0 0.9  PROT 8.3* 6.8  ALBUMIN 4.3 3.5   No results for input(s): LIPASE, AMYLASE in the last 168 hours. Recent Labs  Lab 02/15/21 0716  AMMONIA <9*   Coagulation Profile: No results for input(s): INR, PROTIME in the last 168 hours. Cardiac Enzymes: No results for input(s): CKTOTAL, CKMB, CKMBINDEX, TROPONINI in the last 168 hours. BNP (last 3 results) No results for input(s): PROBNP in the last 8760 hours. HbA1C: Recent Labs    02/15/21 1558  HGBA1C 5.1   CBG: Recent Labs  Lab 02/16/21 0247 02/16/21 0408 02/16/21 0511 02/16/21 0721 02/16/21 1105  GLUCAP 74 70 173* 108* 71   Lipid Profile: No results for input(s): CHOL, HDL, LDLCALC, TRIG, CHOLHDL, LDLDIRECT in the last 72 hours. Thyroid Function Tests: Recent Labs    02/14/21 2101  TSH 6.890*   Anemia Panel: Recent Labs    02/15/21 0716  VITAMINB12 388  FOLATE 31.1   Sepsis Labs: Recent Labs  Lab 02/14/21 2100 02/14/21 2101 02/14/21 2256  PROCALCITON <0.10  --   --   LATICACIDVEN  --  1.2 1.6    Recent Results (from the past 240 hour(s))  Resp Panel by RT-PCR (Flu A&B, Covid) Nasopharyngeal Swab     Status: None   Collection  Time: 02/14/21  8:14 PM   Specimen: Nasopharyngeal Swab; Nasopharyngeal(NP) swabs in vial transport medium  Result Value Ref Range Status   SARS Coronavirus 2 by RT PCR NEGATIVE NEGATIVE Final    Comment: (NOTE) SARS-CoV-2 target nucleic acids are NOT DETECTED.  The SARS-CoV-2 RNA is generally detectable in upper respiratory specimens during the acute phase of infection. The lowest concentration of SARS-CoV-2 viral copies this assay can detect is 138 copies/mL. A negative result does not preclude SARS-Cov-2 infection and should not be used as the sole basis for treatment or other patient management decisions. A negative result may occur with  improper specimen collection/handling, submission of specimen other than nasopharyngeal swab, presence of viral mutation(s) within the areas targeted by this assay, and inadequate number  of viral copies(<138 copies/mL). A negative result must be combined with clinical observations, patient history, and epidemiological information. The expected result is Negative.  Fact Sheet for Patients:  BloggerCourse.com  Fact Sheet for Healthcare Providers:  SeriousBroker.it  This test is no t yet approved or cleared by the [place] FDA and  has been authorized for detection and/or diagnosis of SARS-CoV-2 by FDA under an Emergency Use Authorization (EUA). This EUA will remain  in effect (meaning this test can be used) for the duration of the COVID-19 declaration under Section 564(b)(1) of the Act, 21 U.S.C.section 360bbb-3(b)(1), unless the authorization is terminated  or revoked sooner.       Influenza A by PCR NEGATIVE NEGATIVE Final   Influenza B by PCR NEGATIVE NEGATIVE Final    Comment: (NOTE) The Xpert Xpress SARS-CoV-2/FLU/RSV plus assay is intended as an aid in the diagnosis of influenza from Nasopharyngeal swab specimens and should not be used as a sole basis for treatment. Nasal washings  and aspirates are unacceptable for Xpert Xpress SARS-CoV-2/FLU/RSV testing.  Fact Sheet for Patients: BloggerCourse.com  Fact Sheet for Healthcare Providers: SeriousBroker.it  This test is not yet approved or cleared by the [place] FDA and has been authorized for detection and/or diagnosis of SARS-CoV-2 by FDA under an Emergency Use Authorization (EUA). This EUA will remain in effect (meaning this test can be used) for the duration of the COVID-19 declaration under Section 564(b)(1) of the Act, 21 U.S.C. section 360bbb-3(b)(1), unless the authorization is terminated or revoked.  Performed at [facility], [address] [place], [place] [address]          Radiology Studies: CT Head Wo Contrast  Result Date: 02/14/2021 CLINICAL DATA:  Altered level of consciousness, change in mentation, urinary tract infection EXAM: CT HEAD WITHOUT CONTRAST TECHNIQUE: Contiguous axial images were obtained from the base of the skull through the vertex without intravenous contrast. COMPARISON:  12/02/2009 FINDINGS: Brain: Chronic lacunar infarcts are seen within the bilateral basal ganglia. No acute infarct or hemorrhage. Lateral ventricles and midline structures are unremarkable. No acute extra-axial fluid collections. No mass effect. Vascular: Stable atherosclerosis.  No hyperdense vessel. Skull: Normal. Negative for fracture or focal lesion. Sinuses/Orbits: Partial opacification left frontal sinus. Remaining paranasal sinuses are clear. Other: None. IMPRESSION: 1. No acute intracranial process. 2. Chronic lacunar infarcts bilateral basal ganglia. 3. Left frontal sinus disease. Electronically Signed   By: Sharlet Salina M.D.   On: 02/14/2021 21:09   DG Chest Portable 1 View  Result Date: 02/14/2021 CLINICAL DATA:  Altered level of consciousness, urinary tract infection, change in mentation EXAM: PORTABLE CHEST 1 VIEW COMPARISON:  11/03/2018  FINDINGS: Single frontal view of the chest demonstrates stable dual lead pacemaker, with postsurgical changes from median sternotomy and aortic and mitral valve prostheses. Increased veiling opacity at the left lung base consistent with enlarging left pleural effusion and left basilar consolidation. Right chest is clear. No pneumothorax. No acute bony abnormalities. IMPRESSION: 1. Increased veiling opacity at the left lung base, consistent with left basilar consolidation and enlarging left pleural effusion. Electronically Signed   By: Sharlet Salina M.D.   On: 02/14/2021 21:07        Scheduled Meds: . aspirin EC  325 mg Oral QHS  . donepezil  5 mg Oral QHS  . heparin  5,000 Units Subcutaneous Q8H  . insulin aspart  0-6 Units Subcutaneous Q4H  . levothyroxine  50 mcg Intravenous Daily  . lisinopril  5 mg Oral Daily  .  metoprolol succinate  50 mg Oral Daily  . pravastatin  40 mg Oral q1800   Continuous Infusions: . dextrose 5% lactated ringers 50 mL/hr at 02/16/21 0802  . potassium chloride 10 mEq (02/16/21 1155)     LOS: 1 day    Time spent: 35 minutes    Kaleeyah Cuffie Hoover Brunette, DO [facility]  If 7PM-7AM, please contact night-coverage [email or web address] 02/16/2021, 12:14 PM

## 2021-02-16 NOTE — Evaluation (Signed)
Clinical/Bedside Swallow Evaluation Patient Details  Name: Glenn [name] MRN: [id number] Date of Birth: 04-25-1934  Today's Date: 02/16/2021 Time: SLP Start Time (ACUTE ONLY): 1300 SLP Stop Time (ACUTE ONLY): 1326 SLP Time Calculation (min) (ACUTE ONLY): 26 min  Past Medical History:  Past Medical History:  Diagnosis Date  . CAD (coronary artery disease)    a. 12/2006 CABG x 3: LIMA->LAD, VG->PDA, VG->OM.  Marland Kitchen Constipation   . Elevated PSA   . Enlarged prostate   . H/O aortic valve replacement    a. 12/2006 Tissue AVR @ time of CABG.  . H/O mitral valve replacement    a. 12/2006 MV repair @ time of CABG.  . High cholesterol   . Hypertension   . Pleural effusion, left 2009   Chronic  . Sick sinus syndrome (HCC)    a. 12/2006 s/p MDT dual chamber PPM.  . Thyroid disease    Past Surgical History:  Past Surgical History:  Procedure Laterality Date  . AoV  replacement    . CORONARY ARTERY BYPASS GRAFT    . INTRAOCULAR LENS INSERTION  2007  . MEDIAL PARTIAL KNEE REPLACEMENT  2011  . PACEMAKER INSERTION  2008  . TURP VAPORIZATION  2010   HPI:  Glenn [name]  is a 87 y.o. male, with history of thyroid disease, HTN, HLD, mitral valve replacement, nad more presents to the ED with a chief complaint of altered mental status.  Patient is also having more difficulty with ambulation and does appear to have a history of early dementia.  He was admitted with acute metabolic encephalopathy versus progression of dementia.  Thus far, imaging studies did not demonstrate any acute findings and no significant lab abnormalities are noted. Plan is for dc to home with home hospice once equipment arrives.   Assessment / Plan / Recommendation Clinical Impression  Clinicial swallow evaluation completed at bedside with daughter present. She indicated that Pt had a good appetite prior to his surgery on his nose last week, but that he has a history of coughing after drinking liquids, but has not had  pneumonia reportedly. Pt kept his eyes closed throughout the visit, but did follow a few basic commands (open mouth, say "ahh", swallow). He initially refused po, however with verbal encouragement and ice chip to lips, Pt accepted bolus. Pt took ~240cc water via straw sips over the course of trials with an occasional dry cough, 120 cc NTL tea, a few bites of puree, and small amount of crackers. Pt required encouragement for intake and primarily only wanted water. Pt will be going home with hospice. Recommend liberalizing diet given plan for hospice and allow D2 and thin liquids when Pt is alert and upright and po medications whole or crushed in puree. Pt is at risk for aspiration given altered mental status and suspected h/o of dysphagia, however risks can be minimized by feeding Pt only when alert and motivated to eat/drink. SLP provided education to Pt's daughter to offer po when Pt is most alert, touch spoon/straw to lips to increase awareness to task, and provide good oral care to decrease bacteria load. Pt's daughter was appreciative of recommendations. Above info relayed to RN. SLP will sign off. Reconsult if indicated. SLP Visit Diagnosis: Dysphagia, unspecified (R13.10)    Aspiration Risk  Mild aspiration risk;Risk for inadequate nutrition/hydration    Diet Recommendation Dysphagia 2 (Fine chop);Thin liquid   Liquid Administration via: Straw Medication Administration: Whole meds with puree Supervision: Staff to assist with self feeding;Full  supervision/cueing for compensatory strategies Compensations: Slow rate;Small sips/bites Postural Changes: Seated upright at 90 degrees;Remain upright for at least 30 minutes after po intake    Other  Recommendations Oral Care Recommendations: Oral care BID;Staff/trained caregiver to provide oral care Other Recommendations: Clarify dietary restrictions   Follow up Recommendations 24 hour supervision/assistance      Frequency and Duration             Prognosis Prognosis for Safe Diet Advancement: Guarded Barriers to Reach Goals: Cognitive deficits      Swallow Study   General Date of Onset: 02/14/21 HPI: Glenn [name]  is a 87 y.o. male, with history of thyroid disease, HTN, HLD, mitral valve replacement, nad more presents to the ED with a chief complaint of altered mental status.  Patient is also having more difficulty with ambulation and does appear to have a history of early dementia.  He was admitted with acute metabolic encephalopathy versus progression of dementia.  Thus far, imaging studies did not demonstrate any acute findings and no significant lab abnormalities are noted. Plan is for dc to home with home hospice once equipment arrives. Type of Study: Bedside Swallow Evaluation Previous Swallow Assessment: N/A Diet Prior to this Study: NPO Temperature Spikes Noted: No Respiratory Status: Room air History of Recent Intubation: No Behavior/Cognition: Alert;Pleasant mood;Requires cueing;Lethargic/Drowsy Oral Cavity Assessment: Within Functional Limits Oral Care Completed by SLP: Recent completion by staff Oral Cavity - Dentition:  (has upper dentition, lower dentures (not present)) Vision: Impaired for self-feeding Self-Feeding Abilities: Total assist Patient Positioning: Upright in bed Baseline Vocal Quality: Normal Volitional Cough: Strong Volitional Swallow: Unable to elicit    Oral/Motor/Sensory Function Overall Oral Motor/Sensory Function: Within functional limits (Pt unable to follow directions for formal assessment)   Ice Chips Ice chips: Within functional limits Presentation: Spoon   Thin Liquid Thin Liquid: Impaired Presentation: Straw Oral Phase Impairments:  (needed tactile cues to alternate between straw and spoon) Pharyngeal  Phase Impairments: Cough - Delayed    Nectar Thick Nectar Thick Liquid: Within functional limits Presentation: Straw   Honey Thick Honey Thick Liquid: Not tested   Puree Puree:  Within functional limits Presentation: Spoon   Solid     Solid: Impaired Presentation: Spoon Oral Phase Functional Implications:  (lingual residue)     Thank you,  Havery Moros, CCC-SLP [phone]  Glenn [name] 02/16/2021,1:59 PM

## 2021-02-16 NOTE — Progress Notes (Signed)
Patient spit out Pravachol with applesauce on his gown, Cleaned patient up and tried to feed patient, he refused at this time

## 2021-02-16 NOTE — Progress Notes (Signed)
Palliative: Conference with attending and transition of care team related to patient condition, needs, goals of care.  Mr. Bondar and family have elected for home with hospice care with [facility] care hospice.  Awaiting DME delivery.  Anticipate discharge home 4/3.  Plan: Home with [facility] care hospice services 5/3.  Awaiting DME delivery  No charge Lillia Carmel, NP Palliative medicine team Team phone [phone] Greater than 50% of this time was spent counseling and coordinating care related to the above assessment and plan.

## 2021-02-16 NOTE — Progress Notes (Signed)
Civil engineer, contracting [facility]) Hospital Liaison: RN note    Notified by Transition of Care Manger, Derenda Fennel, CSW of patient/family request for [facility] services at home after discharge. Chart and patient information under review by [facility] physician. Hospice eligibility pending currently.    Writer spoke with patient's daughter, Corrie Dandy to initiate education related to hospice philosophy, services and team approach to care. Mary verbalized understanding of information given. Per discussion, plan is for discharge to home by ambulance.   Please send signed and completed DNR form home with patient/family. Patient will need prescriptions for discharge comfort medications.     DME needs have been discussed, patient currently has the following equipment in the home: none.  Patient/family requests the following DME for delivery to the home: hospital bed and OBT. [facility] equipment manager has been notified and will contact DME provider to arrange delivery to the home. Home address has been verified and is correct in the chart. Corrie Dandy is the family member to contact to arrange time of delivery.     [facility]erral [facility] aware of the above. Please notify [facility] when patient is ready to leave the unit at discharge. (Call [phone] or [phone] after 5pm.) [facility] information and contact numbers given to [facility].      A Please do not hesitate to call with questions.    Thank you,   Elsie Saas, RN, [facility]      [facility] Liaison (listed on [facility] under Hospice /[facility])    [phone]

## 2021-02-16 NOTE — Evaluation (Signed)
Physical Therapy Evaluation Patient Details Name: Glenn [name] MRN: [id number] DOB: 03/03/1934 Today's Date: 02/16/2021   History of Present Illness  Glenn [name]  is a 87 y.o. male, with history of thyroid disease, HTN, HLD, mitral valve replacement, nad more presents to the ED with a chief complaint of altered mental status.  Patient is also having more difficulty with ambulation and does appear to have a history of early dementia.  He was admitted with acute metabolic encephalopathy versus progression of dementia.  Thus far, imaging studies did not demonstrate any acute findings and no significant lab abnormalities are noted. Plan is for dc to home with home hospice once equipment arrives.    Clinical Impression  Pt admitted with above diagnosis. Daughter present throughout session. She reports patient had last dose of Ativan around 8:30 am and should be coming around soon. Patient required max assist for all bed mobility and transfers today. Ambulation not attempted today as patient required max assist to stand to perform lateral scoots at bedside towards head of bed. Pt currently with functional limitations due to the deficits listed below (see PT Problem List). Pt will benefit from skilled PT to increase their independence and safety with mobility to allow discharge to the venue listed below.       Follow Up Recommendations SNF;Supervision/Assistance - 24 hour    Equipment Recommendations  None recommended by PT    Recommendations for Other Services       Precautions / Restrictions Precautions Precautions: Fall Restrictions Weight Bearing Restrictions: No      Mobility  Bed Mobility Overal bed mobility: Needs Assistance Bed Mobility: Rolling;Sidelying to Sit;Sit to Sidelying Rolling: Max assist Sidelying to sit: Max assist     Sit to sidelying: Max assist      Transfers Overall transfer level: Needs assistance Equipment used: 2 person hand held  assist Transfers: Sit to/from Stand;Lateral/Scoot Transfers Sit to Stand: Max assist        Lateral/Scoot Transfers: Max assist General transfer comment: patietn requires bilateral knees blocked for safety; multimodal cuing required  Ambulation/Gait  General Gait Details: not attempted for safety  Stairs    Wheelchair Mobility    Modified Rankin (Stroke Patients Only)       Balance Overall balance assessment: Needs assistance Sitting-balance support: No upper extremity supported;Feet supported Sitting balance-Leahy Scale: Zero   Postural control: Right lateral lean Standing balance support: No upper extremity supported;During functional activity Standing balance-Leahy Scale: Zero Standing balance comment: patient unable to stand without max physical assist         Pertinent Vitals/Pain Pain Assessment: Faces Faces Pain Scale: No hurt Pain Intervention(s): Limited activity within patient's tolerance;Monitored during session;Repositioned    Home Living Family/patient expects to be discharged to:: Private residence Living Arrangements: Children Available Help at Discharge: Family;Personal care attendant;Available 24 hours/day Type of Home: House Home Access: Ramped entrance     Home Layout: One level Home Equipment: Bedside commode;Walker - 4 wheels;Grab bars - tub/shower;Grab bars - toilet      Prior Function Level of Independence: Needs assistance   Gait / Transfers Assistance Needed: walking household distances with RW; HHPT came 1x/wk  ADL's / Homemaking Assistance Needed: assistance for transfers into/out of bathtub; independent for dressing and feeding; daughter did all IADLs        Hand Dominance        Extremity/Trunk Assessment   Upper Extremity Assessment Upper Extremity Assessment: Generalized weakness    Lower Extremity Assessment Lower Extremity Assessment:  Generalized weakness    Cervical / Trunk Assessment Cervical / Trunk  Assessment: Kyphotic  Communication   Communication: HOH  Cognition Arousal/Alertness: Lethargic;Suspect due to medications   Overall Cognitive Status: Impaired/Different from baseline  General Comments: daughter present stating patient received Ativan at approximately 8:30 this morning.      General Comments General comments (skin integrity, edema, etc.): bandages over nose    Exercises     Assessment/Plan    PT Assessment Patient needs continued PT services  PT Problem List Decreased strength;Decreased cognition;Decreased activity tolerance;Decreased balance;Decreased mobility       PT Treatment Interventions Gait training;Functional mobility training;Patient/family education;Therapeutic activities;Therapeutic exercise    PT Goals (Current goals can be found in the Care Plan section)  Acute Rehab PT Goals Patient Stated Goal: Go home with hospice and 24/7 family/CNA care. PT Goal Formulation: With family Time For Goal Achievement: 03/02/21 Potential to Achieve Goals: Fair    Frequency Min 2X/week   Barriers to discharge           AM-PAC PT "6 Clicks" Mobility  Outcome Measure Help needed turning from your back to your side while in a flat bed without using bedrails?: Total Help needed moving from lying on your back to sitting on the side of a flat bed without using bedrails?: Total Help needed moving to and from a bed to a chair (including a wheelchair)?: Total Help needed standing up from a chair using your arms (e.g., wheelchair or bedside chair)?: Total Help needed to walk in hospital room?: Total Help needed climbing 3-5 steps with a railing? : Total 6 Click Score: 6    End of Session   Activity Tolerance: Patient limited by lethargy Patient left: in bed;with family/visitor present;with call bell/phone within reach Nurse Communication: Mobility status PT Visit Diagnosis: Unsteadiness on feet (R26.81);Other abnormalities of gait and mobility (R26.89);Muscle  weakness (generalized) (M62.81);Difficulty in walking, not elsewhere classified (R26.2);Other symptoms and signs involving the nervous system (R29.898)    Time: 1340-1408 PT Time Calculation (min) (ACUTE ONLY): 28 min   Charges:   PT Evaluation $PT Eval Low Complexity: 1 Low PT Treatments $Therapeutic Activity: 8-22 mins        Katina Dung. Hartnett-Rands, MS, PT [facility] PT [facility] [place] [phone]  Britta Mccreedy  Hartnett-Rands 02/16/2021, 2:12 PM

## 2021-02-17 LAB — URINE CULTURE: Culture: NO GROWTH

## 2021-02-17 LAB — GLUCOSE, CAPILLARY
Glucose-Capillary: 103 mg/dL — ABNORMAL HIGH (ref 70–99)
Glucose-Capillary: 107 mg/dL — ABNORMAL HIGH (ref 70–99)
Glucose-Capillary: 84 mg/dL (ref 70–99)
Glucose-Capillary: 98 mg/dL (ref 70–99)

## 2021-02-17 LAB — BASIC METABOLIC PANEL
Anion gap: 6 (ref 5–15)
BUN: 15 mg/dL (ref 8–23)
CO2: 29 mmol/L (ref 22–32)
Calcium: 10 mg/dL (ref 8.9–10.3)
Chloride: 104 mmol/L (ref 98–111)
Creatinine, Ser: 0.91 mg/dL (ref 0.61–1.24)
GFR, Estimated: >60 mL/min (ref >60)
Glucose, Bld: 93 mg/dL (ref 70–99)
Potassium: 3 mmol/L — ABNORMAL LOW (ref 3.5–5.1)
Sodium: 139 mmol/L (ref 135–145)

## 2021-02-17 LAB — MAGNESIUM: Magnesium: 1.9 mg/dL (ref 1.7–2.4)

## 2021-02-17 MED ORDER — MORPHINE SULFATE 20 MG/5ML PO SOLN: 2.5000 mg | ORAL | 0 refills | Status: AC | PRN

## 2021-02-17 MED ORDER — LORAZEPAM 0.5 MG PO TABS: 0.5000 mg | ORAL_TABLET | Freq: Three times a day (TID) | ORAL | 0 refills | Status: AC | PRN

## 2021-02-17 NOTE — Progress Notes (Signed)
Stiches and gauze removed from patient's nose. Xeroform gauze and a band aid in place.

## 2021-02-17 NOTE — Discharge Summary (Signed)
Physician Discharge Summary  Glenn [name] ZOX:[id number] DOB: 1934-08-19 DOA: 02/14/2021  PCP: Erasmo Downer, NP  Admit date: 02/14/2021  Discharge date: 02/17/2021  Admitted From:Home  Disposition:  Home with hospice  Recommendations for Outpatient Follow-up:  1. Follow up home hospice agency 2. Morphine and Ativan prescribed for symptomatic treatment  Home Health:Home hospice  Equipment/Devices:Hospital bed delivered  Discharge Condition:Stable  CODE STATUS: DNR  Diet recommendation: As tolerated  Brief/Interim Summary: [name] a86 y.o.male,with history of thyroid disease, HTN, HLD, mitral valve replacement, nad more presents to the ED with a chief complaint of altered mental status.Patient is also having more difficulty with ambulation and does appear to have a history of early dementia. He was admitted with acute metabolic encephalopathy versus progression of dementia.  During the course of his stay, he was not noted to have any significant lab abnormalities or concerns for infection.  Brain MRI could not be performed on account of his pacemaker.  It appears that he may have progression of his dementia and appears to be of low functional status and poor prognosis in the long-term.  Discussions were had with daughter at bedside who agrees that home hospice would likely be the best option moving forward.  Patient is seen by palliative care and this has now been arranged.  No other acute events noted throughout the course of this admission.  Discharge Diagnoses:  Principal Problem:   Acute metabolic encephalopathy Active Problems:   HLD (hyperlipidemia)   Essential (primary) hypertension   Pleural effusion, left   Thyroid disease   Hypokalemia  Principal discharge diagnosis: Acute encephalopathy in the setting of progression of dementia.  Discharge Instructions  Discharge Instructions    Diet - low sodium heart healthy   Complete by: As directed     Increase activity slowly   Complete by: As directed      Allergies as of 02/17/2021   No Known Allergies     Medication List    STOP taking these medications   aspirin EC 325 MG tablet   cephALEXin 500 MG capsule Commonly known as: KEFLEX   cetirizine 10 MG tablet Commonly known as: ZYRTEC   ciprofloxacin 250 MG tablet Commonly known as: CIPRO   donepezil 5 MG tablet Commonly known as: ARICEPT   ferrous sulfate 325 (65 FE) MG tablet   FIBER CHOICE PO   fluticasone 50 MCG/ACT nasal spray Commonly known as: FLONASE   levothyroxine 88 MCG tablet Commonly known as: SYNTHROID   lisinopril 5 MG tablet Commonly known as: ZESTRIL   lovastatin 40 MG tablet Commonly known as: MEVACOR   metoprolol succinate 50 MG 24 hr tablet Commonly known as: TOPROL-XL   Multi-Vitamins Tabs   nitrofurantoin (macrocrystal-monohydrate) 100 MG capsule Commonly known as: MACROBID   omeprazole 20 MG capsule Commonly known as: PRILOSEC   rivastigmine 1.5 MG capsule Commonly known as: EXELON   Spiriva HandiHaler 18 MCG inhalation capsule Generic drug: tiotropium     TAKE these medications   LORazepam 0.5 MG tablet Commonly known as: Ativan Take 1 tablet (0.5 mg total) by mouth every 8 (eight) hours as needed for anxiety.   morphine 20 MG/5ML solution Take 0.6 mLs (2.4 mg total) by mouth every 2 (two) hours as needed for pain.       Follow-up Information    [facility] Follow up.   Specialty: Hospice and Palliative Medicine Contact information: [address] [place] [address] [phone]  No Known Allergies  Consultations:  Palliative care   Procedures/Studies: CT Head Wo Contrast  Result Date: 02/14/2021 CLINICAL DATA:  Altered level of consciousness, change in mentation, urinary tract infection EXAM: CT HEAD WITHOUT CONTRAST TECHNIQUE: Contiguous axial images were obtained from the base of the skull through the vertex  without intravenous contrast. COMPARISON:  12/02/2009 FINDINGS: Brain: Chronic lacunar infarcts are seen within the bilateral basal ganglia. No acute infarct or hemorrhage. Lateral ventricles and midline structures are unremarkable. No acute extra-axial fluid collections. No mass effect. Vascular: Stable atherosclerosis.  No hyperdense vessel. Skull: Normal. Negative for fracture or focal lesion. Sinuses/Orbits: Partial opacification left frontal sinus. Remaining paranasal sinuses are clear. Other: None. IMPRESSION: 1. No acute intracranial process. 2. Chronic lacunar infarcts bilateral basal ganglia. 3. Left frontal sinus disease. Electronically Signed   By: Sharlet Salina M.D.   On: 02/14/2021 21:09   DG Chest Portable 1 View  Result Date: 02/14/2021 CLINICAL DATA:  Altered level of consciousness, urinary tract infection, change in mentation EXAM: PORTABLE CHEST 1 VIEW COMPARISON:  11/03/2018 FINDINGS: Single frontal view of the chest demonstrates stable dual lead pacemaker, with postsurgical changes from median sternotomy and aortic and mitral valve prostheses. Increased veiling opacity at the left lung base consistent with enlarging left pleural effusion and left basilar consolidation. Right chest is clear. No pneumothorax. No acute bony abnormalities. IMPRESSION: 1. Increased veiling opacity at the left lung base, consistent with left basilar consolidation and enlarging left pleural effusion. Electronically Signed   By: Sharlet Salina M.D.   On: 02/14/2021 21:07      Discharge Exam: Vitals:   02/17/21 0454 02/17/21 0912  BP: (!) 160/80 (!) 166/80  Pulse: 80 63  Resp: 19   Temp: 98 F (36.7 C)   SpO2: 100%    Vitals:   02/16/21 1424 02/16/21 2022 02/17/21 0454 02/17/21 0912  BP: (!) 157/77 (!) 178/75 (!) 160/80 (!) 166/80  Pulse: 61 74 80 63  Resp: 20 18 19    Temp: 98.4 F (36.9 C) 97.8 F (36.6 C) 98 F (36.7 C)   TempSrc: Oral Oral    SpO2: 100% 100% 100%   Weight:      Height:         General: Unresponsive Cardiovascular: RRR, S1/S2 +, no rubs, no gallops Respiratory: CTA bilaterally, no wheezing, no rhonchi Abdominal: Soft, NT, ND, bowel sounds + Extremities: no edema, no cyanosis    The results of significant diagnostics from this hospitalization (including imaging, microbiology, ancillary and laboratory) are listed below for reference.     Microbiology: Recent Results (from the past 240 hour(s))  Resp Panel by RT-PCR (Flu A&B, Covid) Nasopharyngeal Swab     Status: None   Collection Time: 02/14/21  8:14 PM   Specimen: Nasopharyngeal Swab; Nasopharyngeal(NP) swabs in vial transport medium  Result Value Ref Range Status   SARS Coronavirus 2 by RT PCR NEGATIVE NEGATIVE Final    Comment: (NOTE) SARS-CoV-2 target nucleic acids are NOT DETECTED.  The SARS-CoV-2 RNA is generally detectable in upper respiratory specimens during the acute phase of infection. The lowest concentration of SARS-CoV-2 viral copies this assay can detect is 138 copies/mL. A negative result does not preclude SARS-Cov-2 infection and should not be used as the sole basis for treatment or other patient management decisions. A negative result may occur with  improper specimen collection/handling, submission of specimen other than nasopharyngeal swab, presence of viral mutation(s) within the areas targeted by this assay, and inadequate number of  viral copies(<138 copies/mL). A negative result must be combined with clinical observations, patient history, and epidemiological information. The expected result is Negative.  Fact Sheet for Patients:  BloggerCourse.com  Fact Sheet for Healthcare Providers:  SeriousBroker.it  This test is no t yet approved or cleared by the [place] FDA and  has been authorized for detection and/or diagnosis of SARS-CoV-2 by FDA under an Emergency Use Authorization (EUA). This EUA will remain  in effect  (meaning this test can be used) for the duration of the COVID-19 declaration under Section 564(b)(1) of the Act, 21 U.S.C.section 360bbb-3(b)(1), unless the authorization is terminated  or revoked sooner.       Influenza A by PCR NEGATIVE NEGATIVE Final   Influenza B by PCR NEGATIVE NEGATIVE Final    Comment: (NOTE) The Xpert Xpress SARS-CoV-2/FLU/RSV plus assay is intended as an aid in the diagnosis of influenza from Nasopharyngeal swab specimens and should not be used as a sole basis for treatment. Nasal washings and aspirates are unacceptable for Xpert Xpress SARS-CoV-2/FLU/RSV testing.  Fact Sheet for Patients: BloggerCourse.com  Fact Sheet for Healthcare Providers: SeriousBroker.it  This test is not yet approved or cleared by the [place] FDA and has been authorized for detection and/or diagnosis of SARS-CoV-2 by FDA under an Emergency Use Authorization (EUA). This EUA will remain in effect (meaning this test can be used) for the duration of the COVID-19 declaration under Section 564(b)(1) of the Act, 21 U.S.C. section 360bbb-3(b)(1), unless the authorization is terminated or revoked.  Performed at [facility], [address] [place], [place] [address]   Urine culture     Status: None   Collection Time: 02/14/21  8:14 PM   Specimen: Urine, Clean Catch  Result Value Ref Range Status   Specimen Description   Final    URINE, CLEAN CATCH Performed at [facility], [address], [place], [place] [address]    Special Requests   Final    NONE Performed at [facility], [address] [place], [place] [address]    Culture   Final    NO GROWTH Performed at [facility], [address], [place], [place] [address]    Report Status 02/17/2021 FINAL  Final     Labs: BNP (last 3 results) No results for input(s): BNP in the last 8760 hours. Basic Metabolic Panel: Recent Labs  Lab 02/14/21 2100 02/15/21 0538  02/16/21 0511 02/17/21 0429  NA 137 142 138 139  K 3.3* 3.4* 3.1* 3.0*  CL 99 105 105 104  CO2 29 28 25 29   GLUCOSE 90 87 204* 93  BUN 26* 25* 27* 15  CREATININE 1.34* 1.18 1.26* 0.91  CALCIUM 10.9* 10.1 9.6 10.0  MG  --  1.9 1.8 1.9   Liver Function Tests: Recent Labs  Lab 02/14/21 2100 02/15/21 0538  AST 44* 35  ALT 17 14  ALKPHOS 86 70  BILITOT 1.0 0.9  PROT 8.3* 6.8  ALBUMIN 4.3 3.5   No results for input(s): LIPASE, AMYLASE in the last 168 hours. Recent Labs  Lab 02/15/21 0716  AMMONIA <9*   CBC: Recent Labs  Lab 02/14/21 2100 02/15/21 0538 02/16/21 0511  WBC 6.3 6.9 5.0  NEUTROABS  --  5.4  --   HGB 12.0* 13.1 11.4*  HCT 37.0* 39.8 35.8*  MCV 93.9 93.6 94.5  PLT 180 180 155   Cardiac Enzymes: No results for input(s): CKTOTAL, CKMB, CKMBINDEX, TROPONINI in the last 168 hours. BNP: Invalid input(s):  POCBNP CBG: Recent Labs  Lab 02/16/21 1613 02/16/21 2017 02/17/21 0017 02/17/21 0401 02/17/21 0728  GLUCAP 91 94 107* 84 98   D-Dimer No results for input(s): DDIMER in the last 72 hours. Hgb A1c Recent Labs    02/15/21 1558  HGBA1C 5.1   Lipid Profile No results for input(s): CHOL, HDL, LDLCALC, TRIG, CHOLHDL, LDLDIRECT in the last 72 hours. Thyroid function studies Recent Labs    02/14/21 2101  TSH 6.890*   Anemia work up Recent Labs    02/15/21 0716  VITAMINB12 388  FOLATE 31.1   Urinalysis    Component Value Date/Time   COLORURINE YELLOW 02/14/2021 2014   APPEARANCEUR CLEAR 02/14/2021 2014   APPEARANCEUR Cloudy (A) 01/17/2018 1422   LABSPEC 1.018 02/14/2021 2014   PHURINE 6.0 02/14/2021 2014   GLUCOSEU NEGATIVE 02/14/2021 2014   HGBUR NEGATIVE 02/14/2021 2014   BILIRUBINUR NEGATIVE 02/14/2021 2014   BILIRUBINUR Negative 01/17/2018 1422   KETONESUR 5 (A) 02/14/2021 2014   PROTEINUR 30 (A) 02/14/2021 2014   UROBILINOGEN 0.2 07/05/2014 1749   NITRITE NEGATIVE 02/14/2021 2014   LEUKOCYTESUR NEGATIVE 02/14/2021 2014    Sepsis Labs Invalid input(s): PROCALCITONIN,  WBC,  LACTICIDVEN Microbiology Recent Results (from the past 240 hour(s))  Resp Panel by RT-PCR (Flu A&B, Covid) Nasopharyngeal Swab     Status: None   Collection Time: 02/14/21  8:14 PM   Specimen: Nasopharyngeal Swab; Nasopharyngeal(NP) swabs in vial transport medium  Result Value Ref Range Status   SARS Coronavirus 2 by RT PCR NEGATIVE NEGATIVE Final    Comment: (NOTE) SARS-CoV-2 target nucleic acids are NOT DETECTED.  The SARS-CoV-2 RNA is generally detectable in upper respiratory specimens during the acute phase of infection. The lowest concentration of SARS-CoV-2 viral copies this assay can detect is 138 copies/mL. A negative result does not preclude SARS-Cov-2 infection and should not be used as the sole basis for treatment or other patient management decisions. A negative result may occur with  improper specimen collection/handling, submission of specimen other than nasopharyngeal swab, presence of viral mutation(s) within the areas targeted by this assay, and inadequate number of viral copies(<138 copies/mL). A negative result must be combined with clinical observations, patient history, and epidemiological information. The expected result is Negative.  Fact Sheet for Patients:  BloggerCourse.com  Fact Sheet for Healthcare Providers:  SeriousBroker.it  This test is no t yet approved or cleared by the [place] FDA and  has been authorized for detection and/or diagnosis of SARS-CoV-2 by FDA under an Emergency Use Authorization (EUA). This EUA will remain  in effect (meaning this test can be used) for the duration of the COVID-19 declaration under Section 564(b)(1) of the Act, 21 U.S.C.section 360bbb-3(b)(1), unless the authorization is terminated  or revoked sooner.       Influenza A by PCR NEGATIVE NEGATIVE Final   Influenza B by PCR NEGATIVE NEGATIVE Final     Comment: (NOTE) The Xpert Xpress SARS-CoV-2/FLU/RSV plus assay is intended as an aid in the diagnosis of influenza from Nasopharyngeal swab specimens and should not be used as a sole basis for treatment. Nasal washings and aspirates are unacceptable for Xpert Xpress SARS-CoV-2/FLU/RSV testing.  Fact Sheet for Patients: BloggerCourse.com  Fact Sheet for Healthcare Providers: SeriousBroker.it  This test is not yet approved or cleared by the [place] FDA and has been authorized for detection and/or diagnosis of SARS-CoV-2 by FDA under an Emergency Use Authorization (EUA). This EUA will remain in effect (meaning this test can be  used) for the duration of the COVID-19 declaration under Section 564(b)(1) of the Act, 21 U.S.C. section 360bbb-3(b)(1), unless the authorization is terminated or revoked.  Performed at [facility], [address] [place], [place] [address]   Urine culture     Status: None   Collection Time: 02/14/21  8:14 PM   Specimen: Urine, Clean Catch  Result Value Ref Range Status   Specimen Description   Final    URINE, CLEAN CATCH Performed at [facility], [address] [place], [place] [address]    Special Requests   Final    NONE Performed at [facility], [address] [place], [place] [address]    Culture   Final    NO GROWTH Performed at [facility], [address] [place], [place] [address]    Report Status 02/17/2021 FINAL  Final     Time coordinating discharge: 35 minutes  SIGNED:   Erick BlinksPratik D Zabella Wease, DO [facility] 02/17/2021, 10:38 AM  If 7PM-7AM, please contact night-coverage [email or web address]

## 2021-02-17 NOTE — TOC Transition Note (Signed)
Transition of Care [facility]) - CM/SW Discharge Note   Patient Details  Name: DIANNE BADY MRN: [id number] Date of Birth: 1934/08/11  Transition of [facility]) CM/SW Contact:  Elliot Gault, LCSW Phone Number: 02/17/2021, 10:25 AM   Clinical Narrative:     Pt stable for dc home with hospice today as planned. Informed by RN that pt is ready for dc and needs EMS transport. Transport arranged. [facility] will follow pt at home.  No other TOC needs for dc.  Final next level of care: Home w Hospice Care Barriers to Discharge: Barriers Resolved   Patient Goals and CMS Choice Patient states their goals for this hospitalization and ongoing recovery are:: return home with hospice CMS Medicare.gov Compare Post Acute Care list provided to:: Patient Represenative (must comment) Choice offered to / list presented to : Adult Children  Discharge Placement                       Discharge Plan and Services In-house Referral: Clinical Social Work,Hospice / Palliative Care   Post Acute Care Choice: Hospice          DME Arranged: Hospital bed,Overbed table DME Agency: Other - Comment [name]) Date DME Agency Contacted: 02/16/21 Time DME Agency Contacted: 1118 Representative spoke with at DME Agency: [facility] and [place]   [employer] Agency: Other - See comment [name])        Social Determinants of Health (SDOH) Interventions     Readmission Risk Interventions Readmission Risk Prevention Plan 02/17/2021  Transportation Screening Complete  Home Care Screening Complete  Medication Review (RN CM) Complete  Some recent data might be hidden

## 2021-11-18 DEATH — deceased

## 2022-04-06 IMAGING — DX DG CHEST 1V PORT
1 series · 1 of 1 positions shown · non-contrast
Comparison: 11/03/2018

CLINICAL DATA: Altered level of consciousness, urinary tract
infection, change in mentation

EXAM:
PORTABLE CHEST 1 VIEW

[chest ap]
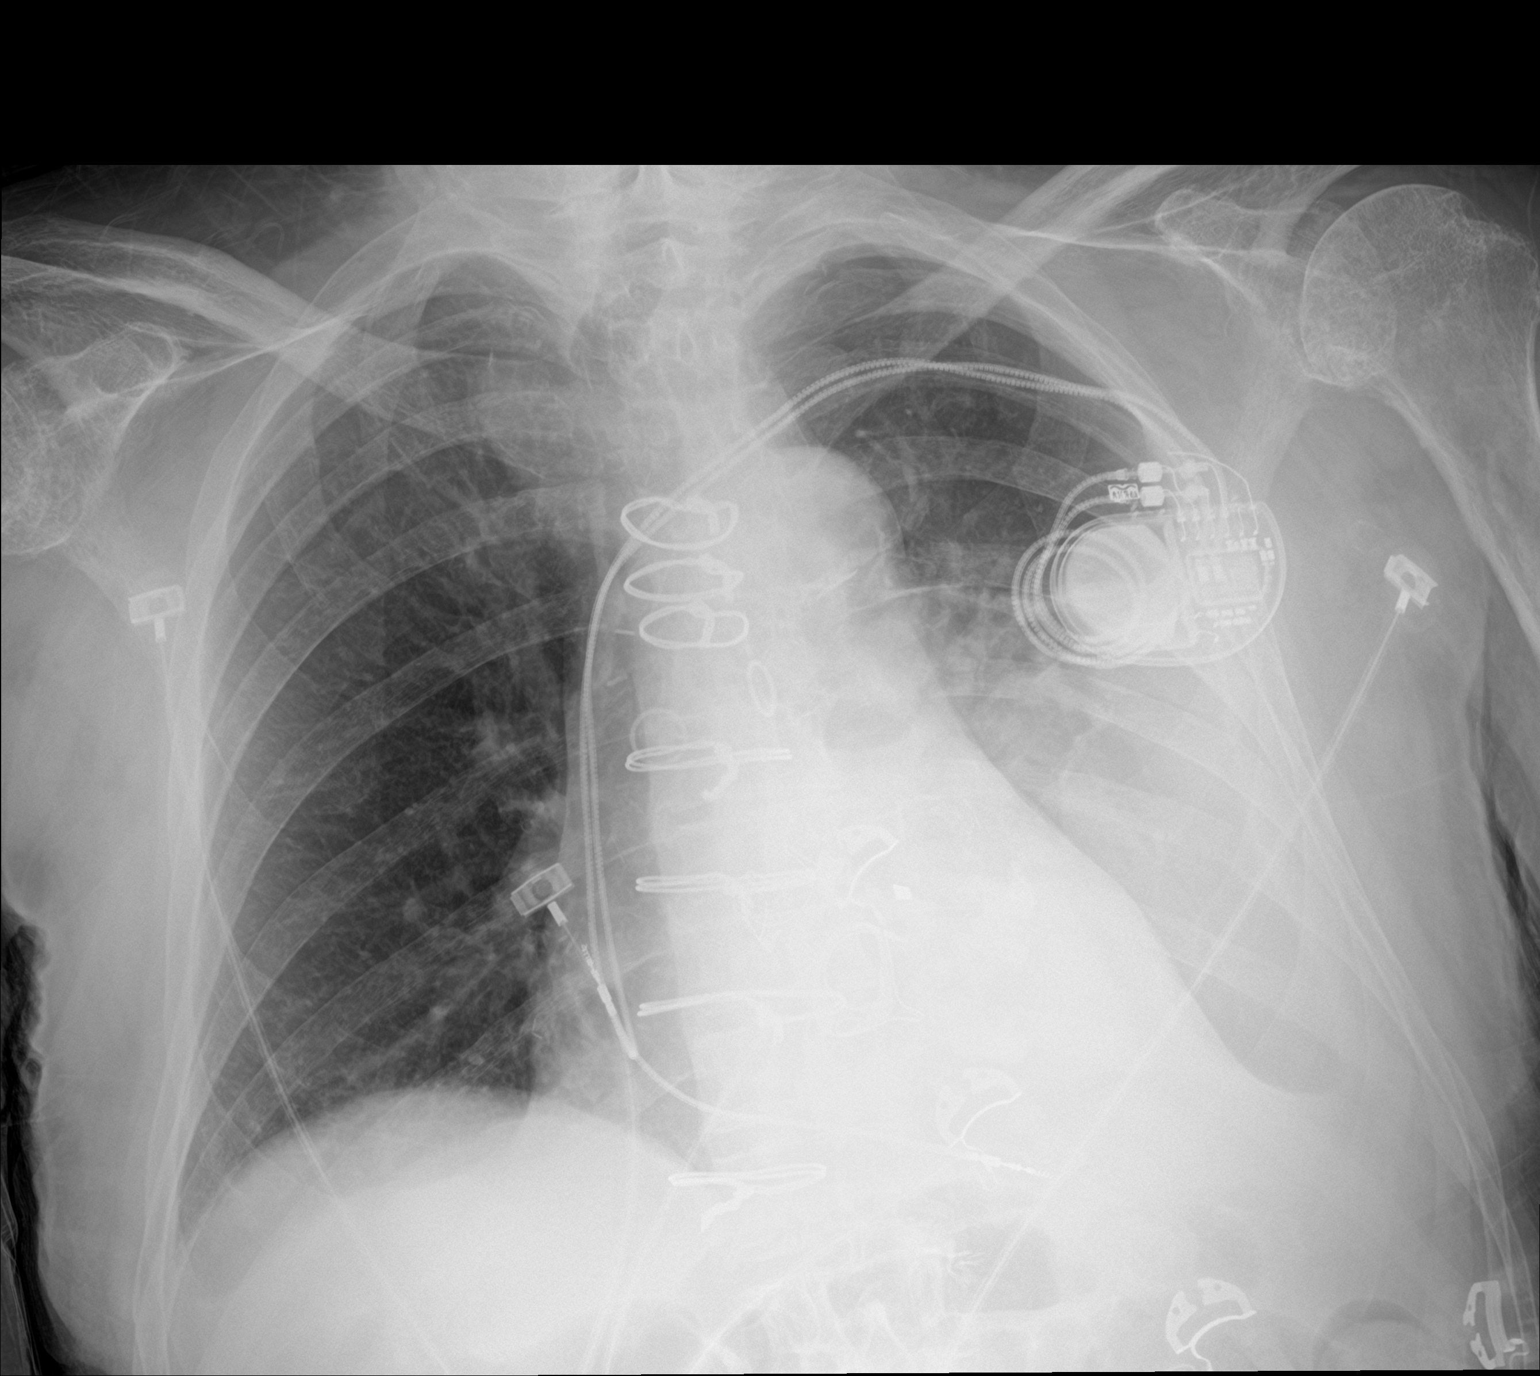

[1 of 1 positions shown; findings below may reference images not displayed]

FINDINGS: Single frontal view of the chest demonstrates stable dual lead
pacemaker, with postsurgical changes from median sternotomy and
aortic and mitral valve prostheses. Increased veiling opacity at the
left lung base consistent with enlarging left pleural effusion and
left basilar consolidation. Right chest is clear. No pneumothorax.
No acute bony abnormalities.
IMPRESSION: 1. Increased veiling opacity at the left lung base, consistent with
left basilar consolidation and enlarging left pleural effusion.
